# Patient Record
Sex: Male | Born: 1970 | Hispanic: No | Marital: Married | Smoking: Former smoker
Health system: Southern US, Community
[De-identification: ages and names within clinical notes are randomized; demographics above are authoritative.]

## PROBLEM LIST (undated history)

## (undated) DIAGNOSIS — K219 Gastro-esophageal reflux disease without esophagitis: Secondary | ICD-10-CM

## (undated) HISTORY — PX: CHOLECYSTECTOMY: SHX55

---
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
## 2011-08-01 ENCOUNTER — Other Ambulatory Visit: Payer: Self-pay | Admitting: Gastroenterology

## 2011-08-01 DIAGNOSIS — R7402 Elevation of levels of lactic acid dehydrogenase (LDH): Secondary | ICD-10-CM

## 2011-08-10 ENCOUNTER — Ambulatory Visit
Admission: RE | Admit: 2011-08-10 | Discharge: 2011-08-10 | Disposition: A | Discharge status: Home / Self Care | Payer: BC Managed Care – PPO | Source: Ambulatory Visit | Attending: Gastroenterology | Admitting: Gastroenterology

## 2011-08-10 DIAGNOSIS — R7402 Elevation of levels of lactic acid dehydrogenase (LDH): Secondary | ICD-10-CM

## 2016-11-08 ENCOUNTER — Encounter (HOSPITAL_BASED_OUTPATIENT_CLINIC_OR_DEPARTMENT_OTHER): Payer: Self-pay

## 2016-11-08 ENCOUNTER — Emergency Department (HOSPITAL_BASED_OUTPATIENT_CLINIC_OR_DEPARTMENT_OTHER)
Admission: EM | Admit: 2016-11-08 | Discharge: 2016-11-08 | Disposition: A | Discharge status: Home / Self Care | Payer: BLUE CROSS/BLUE SHIELD | Attending: Emergency Medicine | Admitting: Emergency Medicine

## 2016-11-08 ENCOUNTER — Emergency Department (HOSPITAL_BASED_OUTPATIENT_CLINIC_OR_DEPARTMENT_OTHER): Payer: BLUE CROSS/BLUE SHIELD

## 2016-11-08 DIAGNOSIS — R945 Abnormal results of liver function studies: Secondary | ICD-10-CM

## 2016-11-08 DIAGNOSIS — R7989 Other specified abnormal findings of blood chemistry: Secondary | ICD-10-CM | POA: Diagnosis not present

## 2016-11-08 DIAGNOSIS — Z87891 Personal history of nicotine dependence: Secondary | ICD-10-CM | POA: Diagnosis not present

## 2016-11-08 DIAGNOSIS — R1013 Epigastric pain: Secondary | ICD-10-CM | POA: Diagnosis present

## 2016-11-08 DIAGNOSIS — K839 Disease of biliary tract, unspecified: Secondary | ICD-10-CM | POA: Insufficient documentation

## 2016-11-08 DIAGNOSIS — K8050 Calculus of bile duct without cholangitis or cholecystitis without obstruction: Secondary | ICD-10-CM

## 2016-11-08 HISTORY — DX: Gastro-esophageal reflux disease without esophagitis: K21.9

## 2016-11-08 LAB — COMPREHENSIVE METABOLIC PANEL
ALT: 104 U/L — ABNORMAL HIGH (ref 17–63)
AST: 46 U/L — AB (ref 15–41)
Albumin: 4.5 g/dL (ref 3.5–5.0)
Alkaline Phosphatase: 162 U/L — ABNORMAL HIGH (ref 38–126)
Anion gap: 7 (ref 5–15)
BILIRUBIN TOTAL: 0.4 mg/dL (ref 0.3–1.2)
BUN: 13 mg/dL (ref 6–20)
CO2: 31 mmol/L (ref 22–32)
CREATININE: 0.9 mg/dL (ref 0.61–1.24)
Calcium: 9.5 mg/dL (ref 8.9–10.3)
Chloride: 99 mmol/L — ABNORMAL LOW (ref 101–111)
GFR calc Af Amer: >60 mL/min (ref >60)
Glucose, Bld: 136 mg/dL — ABNORMAL HIGH (ref 65–99)
Potassium: 3.5 mmol/L (ref 3.5–5.1)
Sodium: 137 mmol/L (ref 135–145)
TOTAL PROTEIN: 7.2 g/dL (ref 6.5–8.1)

## 2016-11-08 LAB — CBC WITH DIFFERENTIAL/PLATELET
Basophils Absolute: 0.1 10*3/uL (ref 0.0–0.1)
Basophils Relative: 1 %
Eosinophils Absolute: 0.6 10*3/uL (ref 0.0–0.7)
Eosinophils Relative: 9 %
HEMATOCRIT: 45.2 % (ref 39.0–52.0)
Hemoglobin: 15.5 g/dL (ref 13.0–17.0)
LYMPHS ABS: 2.3 10*3/uL (ref 0.7–4.0)
LYMPHS PCT: 38 %
MCH: 29.6 pg (ref 26.0–34.0)
MCHC: 34.3 g/dL (ref 30.0–36.0)
MCV: 86.4 fL (ref 78.0–100.0)
MONO ABS: 0.4 10*3/uL (ref 0.1–1.0)
MONOS PCT: 6 %
Neutro Abs: 2.8 10*3/uL (ref 1.7–7.7)
Neutrophils Relative %: 46 %
Platelets: 185 10*3/uL (ref 150–400)
RBC: 5.23 MIL/uL (ref 4.22–5.81)
RDW: 12.7 % (ref 11.5–15.5)
WBC: 6.1 10*3/uL (ref 4.0–10.5)

## 2016-11-08 LAB — LIPASE, BLOOD: LIPASE: 27 U/L (ref 11–51)

## 2016-11-08 MED ORDER — MORPHINE SULFATE (PF) 4 MG/ML IV SOLN
4.0000 mg | Freq: Once | INTRAVENOUS | Status: AC
  Administered 2016-11-08: 4 mg via INTRAVENOUS
  Filled 2016-11-08: qty 1

## 2016-11-08 MED ORDER — IOPAMIDOL (ISOVUE-300) INJECTION 61%
100.0000 mL | Freq: Once | INTRAVENOUS | Status: AC | PRN
  Administered 2016-11-08: 100 mL via INTRAVENOUS

## 2016-11-08 MED ORDER — ONDANSETRON HCL 4 MG/2ML IJ SOLN
4.0000 mg | Freq: Once | INTRAMUSCULAR | Status: AC
  Administered 2016-11-08: 4 mg via INTRAVENOUS
  Filled 2016-11-08: qty 2

## 2016-11-08 MED ORDER — HYDROCODONE-ACETAMINOPHEN 5-325 MG PO TABS: 1.0000 | ORAL_TABLET | Freq: Four times a day (QID) | ORAL | 0 refills | Status: DC | PRN

## 2016-11-08 MED ORDER — ONDANSETRON 4 MG PO TBDP
ORAL_TABLET | ORAL | Status: AC
  Administered 2016-11-08: 4 mg
  Filled 2016-11-08: qty 1

## 2016-11-08 MED ORDER — ONDANSETRON 4 MG PO TBDP: 4.0000 mg | ORAL_TABLET | Freq: Three times a day (TID) | ORAL | 0 refills | Status: DC | PRN

## 2016-11-08 MED ORDER — PANTOPRAZOLE SODIUM 40 MG PO TBEC
40.0000 mg | DELAYED_RELEASE_TABLET | Freq: Once | ORAL | Status: AC
  Administered 2016-11-08: 40 mg via ORAL
  Filled 2016-11-08: qty 1

## 2016-11-08 MED ORDER — PANTOPRAZOLE SODIUM 40 MG IV SOLR
40.0000 mg | Freq: Once | INTRAVENOUS | Status: AC
  Administered 2016-11-08: 40 mg via INTRAVENOUS
  Filled 2016-11-08: qty 40

## 2016-11-08 MED ORDER — GI COCKTAIL ~~LOC~~
30.0000 mL | Freq: Once | ORAL | Status: AC
  Administered 2016-11-08: 30 mL via ORAL
  Filled 2016-11-08: qty 30

## 2016-11-08 MED ORDER — ONDANSETRON 4 MG PO TBDP
ORAL_TABLET | ORAL | Status: AC
  Filled 2016-11-08: qty 1

## 2016-11-08 NOTE — ED Notes (Signed)
Pt rang out to state he needs to vomit

## 2016-11-08 NOTE — ED Triage Notes (Signed)
Pt c/o several episodes of epigastric burning and pressure for the last month.  He went to a clinic about 2 weeks ago and was given what looks like a GI cocktail prescription and told to take omeprazole daily.  He has not taken omeprazole, but has been using the GI cocktail and getting relief.  Tonight, he has had the pain since 2200 despite the medication.  He last ate at 1900, denies SOB, denies diaphoresis.  States the pain goes through to his middle back.

## 2016-11-08 NOTE — ED Provider Notes (Signed)
[facility]   CSN: [id number] Arrival date & time: 11/08/16  0245     History   Chief Complaint Chief Complaint  Patient presents with  . Epigastric Pain    HPI Kristin BruinsRoger Shellenbarger is a 46 y.o. male.  HPI  This is a 46 year old male who presents with upper abdominal pain. Patient reports that he has been seen twice in the last month for recurrent postprandial pain. He has been treated with a GI cocktail which "sometimes helps." He was also told to take omeprazole daily but has not been doing this. Tonight he has had worsening epigastric pain over the last several hours. No fevers. Reports nausea without vomiting. No diarrhea. Current pain is 9 out of 10. Denies chest pain or shortness of breath.  Past Medical History:  Diagnosis Date  . GERD (gastroesophageal reflux disease)     There are no active problems to display for this patient.   Past Surgical History:  Procedure Laterality Date  . APPENDECTOMY  1988       Home Medications    Prior to Admission medications   Medication Sig Start Date End Date Taking? Authorizing Provider  HYDROcodone-acetaminophen (NORCO/VICODIN) 5-325 MG tablet Take 1-2 tablets by mouth every 6 (six) hours as needed. 11/08/16   Horton, Mayer Maskerourtney F, MD  ondansetron (ZOFRAN ODT) 4 MG disintegrating tablet Take 1 tablet (4 mg total) by mouth every 8 (eight) hours as needed for nausea or vomiting. 11/08/16   Horton, Mayer Maskerourtney F, MD    Family History No family history on file.  Social History Social History  Substance Use Topics  . Smoking status: Former Games developermoker  . Smokeless tobacco: Never Used  . Alcohol use Yes     Comment: occasional     Allergies   Patient has no known allergies.   Review of Systems Review of Systems  Constitutional: Negative for fever.  Respiratory: Negative for shortness of breath.   Cardiovascular: Negative for chest pain.  Gastrointestinal: Positive for abdominal pain and nausea. Negative  for diarrhea and vomiting.  Genitourinary: Negative for dysuria.  All other systems reviewed and are negative.    Physical Exam Updated Vital Signs BP (!) 143/90   Pulse 60   Temp 98.1 F (36.7 C) (Oral)   Resp 18   Ht 5\' 7"  (1.702 m)   Wt 72.6 kg (160 lb)   SpO2 99%   BMI 25.06 kg/m   Physical Exam  Constitutional: He is oriented to person, place, and time. He appears well-developed and well-nourished.  HENT:  Head: Normocephalic and atraumatic.  Cardiovascular: Normal rate, regular rhythm and normal heart sounds.   No murmur heard. Pulmonary/Chest: Effort normal and breath sounds normal. No respiratory distress. He has no wheezes.  Abdominal: Soft. Bowel sounds are normal. There is tenderness. There is no rebound.  Epigastric tenderness palpation, negative Murphy sign  Musculoskeletal: He exhibits no edema.  Neurological: He is alert and oriented to person, place, and time.  Skin: Skin is warm and dry.  Psychiatric: He has a normal mood and affect.  Nursing note and vitals reviewed.    ED Treatments / Results  Labs (all labs ordered are listed, but only abnormal results are displayed) Labs Reviewed  COMPREHENSIVE METABOLIC PANEL - Abnormal; Notable for the following:       Result Value   Chloride 99 (*)    Glucose, Bld 136 (*)    AST 46 (*)    ALT 104 (*)  Alkaline Phosphatase 162 (*)    All other components within normal limits  CBC WITH DIFFERENTIAL/PLATELET  LIPASE, BLOOD    EKG  EKG Interpretation  Date/Time:  Wednesday November 08 2016 02:50:46 EDT Ventricular Rate:  57 PR Interval:    QRS Duration: 98 QT Interval:  426 QTC Calculation: 415 R Axis:   92 Text Interpretation:  Sinus rhythm Consider left atrial enlargement Borderline right axis deviation Probable left ventricular hypertrophy ST elev, probable normal early repol pattern No prior for comparison Confirmed by Ross Marcus ([id number]) on 11/08/2016 3:17:55 AM       Radiology Ct  Abdomen Pelvis W Contrast  Result Date: 11/08/2016 CLINICAL DATA:  Epigastric pain.  Nausea. EXAM: CT ABDOMEN AND PELVIS WITH CONTRAST TECHNIQUE: Multidetector CT imaging of the abdomen and pelvis was performed using the standard protocol following bolus administration of intravenous contrast. CONTRAST:  ISOVUE-300 IOPAMIDOL (ISOVUE-300) INJECTION 61% COMPARISON:  Ultrasound 07/31/2011 FINDINGS: Lower chest: Linear right lower lobe atelectasis. No consolidation or pleural fluid. Hepatobiliary: Diffusely decreased density consistent with steatosis. No focal hepatic lesion. Gallstones within the gallbladder which is physiologically distended. No pericholecystic inflammation. No biliary ductal dilatation. Pancreas: No ductal dilatation or inflammation. Spleen: Normal in size without focal abnormality. Adrenals/Urinary Tract: Adrenal glands are unremarkable. Kidneys are normal, without renal calculi, focal lesion, or hydronephrosis. Bladder is unremarkable. Stomach/Bowel: Stomach distended with ingested contrast. No gastric wall thickening. No small bowel obstruction, inflammation or wall thickening. Moderate stool in the cecum and ascending colon, small volume of stool in the descending and sigmoid colon. No colonic inflammation. Post appendectomy. Vascular/Lymphatic: No significant vascular findings are present. No enlarged abdominal or pelvic lymph nodes. Reproductive: Prostate is unremarkable. Other: No free air, free fluid, or intra-abdominal fluid collection. Tiny fat containing umbilical hernia. Musculoskeletal: There are no acute or suspicious osseous abnormalities. Bone island in the right proximal femur. IMPRESSION: 1. No acute abnormality or explanation for abdominal pain. 2. Cholelithiasis without CT findings of gallbladder inflammation. 3. Hepatic steatosis. Electronically Signed   By: Rubye Oaks M.D.   On: 11/08/2016 05:51    Procedures Procedures (including critical care  time)  Medications Ordered in ED Medications  ondansetron (ZOFRAN-ODT) 4 MG disintegrating tablet (not administered)  gi cocktail (Maalox,Lidocaine,Donnatal) (30 mLs Oral Given 11/08/16 0320)  pantoprazole (PROTONIX) EC tablet 40 mg (40 mg Oral Given 11/08/16 0319)  ondansetron (ZOFRAN-ODT) 4 MG disintegrating tablet (4 mg  Given 11/08/16 0325)  pantoprazole (PROTONIX) injection 40 mg (40 mg Intravenous Given 11/08/16 0342)  ondansetron (ZOFRAN) injection 4 mg (4 mg Intravenous Given 11/08/16 0357)  morphine 4 MG/ML injection 4 mg (4 mg Intravenous Given 11/08/16 0414)  iopamidol (ISOVUE-300) 61 % injection 100 mL (100 mLs Intravenous Contrast Given 11/08/16 0526)     Initial Impression / Assessment and Plan / ED Course  I have reviewed the triage vital signs and the nursing notes.  Pertinent labs & imaging results that were available during my care of the patient were reviewed by me and considered in my medical decision making (see chart for details).     Patient presents with refractory postprandial abdominal pain. He is nontoxic but uncomfortable appearing on exam. Mostly epigastric tenderness. Considerations include reflux, peptic ulcers, pancreatitis, gallbladder pathology. He denies daily alcohol use. Initially patient was given a GI cocktail and oral Zofran. However, he developed emesis that was nonbloody. IV was placed and patient was given pain and nausea medication. Initial workup notable for elevated LFTs with AST of 46, ALT 104,  alkaline phosphatase 162. No access to the formal ultrasound at this time. CT scan obtained and does show evidence of cholelithiasis. No evidence of gallbladder inflammation. On recheck, patient's pain is down to her before. He states he feels much better. He is able now to tolerate fluids. Suspect he may have it on a biliary colic. No indication right now for emergent admission and gallbladder removal; however, he does need to follow-up with general surgery and  needs follow-up within 1 week for repeat LFTs. I discussed this with the patient. He understands and agrees with plan. He was also given return precautions including worsening pain, fevers, intractable vomiting.  After history, exam, and medical workup I feel the patient has been appropriately medically screened and is safe for discharge home. Pertinent diagnoses were discussed with the patient. Patient was given return precautions.   Final Clinical Impressions(s) / ED Diagnoses   Final diagnoses:  Biliary colic  Elevated LFTs    New Prescriptions New Prescriptions   HYDROCODONE-ACETAMINOPHEN (NORCO/VICODIN) 5-325 MG TABLET    Take 1-2 tablets by mouth every 6 (six) hours as needed.   ONDANSETRON (ZOFRAN ODT) 4 MG DISINTEGRATING TABLET    Take 1 tablet (4 mg total) by mouth every 8 (eight) hours as needed for nausea or vomiting.     Shon Baton, MD 11/08/16 0630

## 2016-11-08 NOTE — ED Notes (Signed)
Pt drinking contrast, scan to be performed around 0530.

## 2016-11-08 NOTE — ED Notes (Signed)
Pt verbalizes understanding of d/c instructions and denies any further needs at this time. 

## 2016-11-08 NOTE — ED Notes (Signed)
Patient transported to CT 

## 2016-11-08 NOTE — ED Notes (Signed)
Pt returned from CT °

## 2016-11-08 NOTE — ED Notes (Signed)
Pt tolerating PO fluids

## 2016-11-08 NOTE — Discharge Instructions (Signed)
You were seen today for upper abdominal pain associated with eating. You have gallstones. You also have evidence of mild elevation of her liver enzymes. You need to have these rechecked by her primary physician in one week. Contact the general surgeon provided above to discuss gallbladder removal. If you develop worsening pain, fevers, recurrent vomiting, you should be reevaluated immediately.

## 2016-11-13 ENCOUNTER — Other Ambulatory Visit: Payer: Self-pay | Admitting: Surgery

## 2016-11-27 ENCOUNTER — Other Ambulatory Visit: Payer: Self-pay | Admitting: Surgery

## 2017-11-16 IMAGING — CT CT ABD-PELV W/ CM
2 of 5 series · 16 of 46 positions shown, 18 images · IV contrast (APPLIED)
Comparison: Ultrasound 07/31/2011

CLINICAL DATA: Epigastric pain.  Nausea.

EXAM:
CT ABDOMEN AND PELVIS WITH CONTRAST
TECHNIQUE: Multidetector CT imaging of the abdomen and pelvis was performed
using the standard protocol following bolus administration of
intravenous contrast.
CONTRAST:  100mL WC4OIC-188 IOPAMIDOL (WC4OIC-188) INJECTION 61%

[Series 3: axial st · axial · 0.77mm/px · z∈[-514,-28]mm · 13 of 111 slices shown, 15 images]
[im 7/111  soft-tissue]
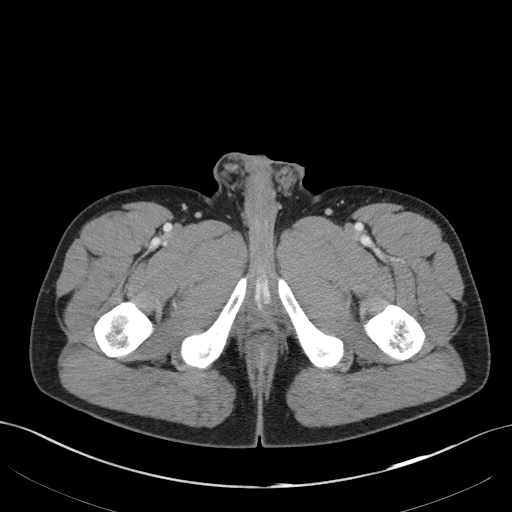
[im 7/111  bone]
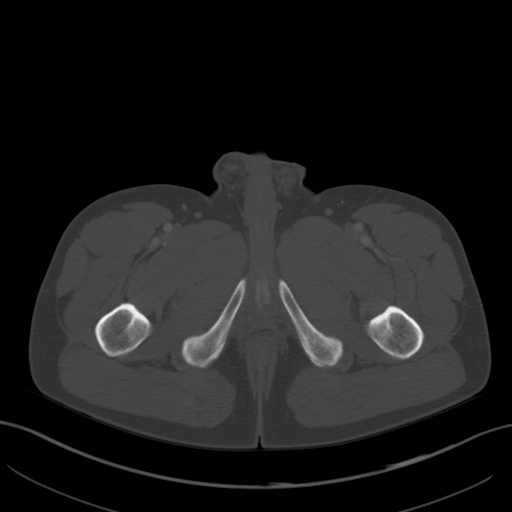
[im 13/111  soft-tissue]
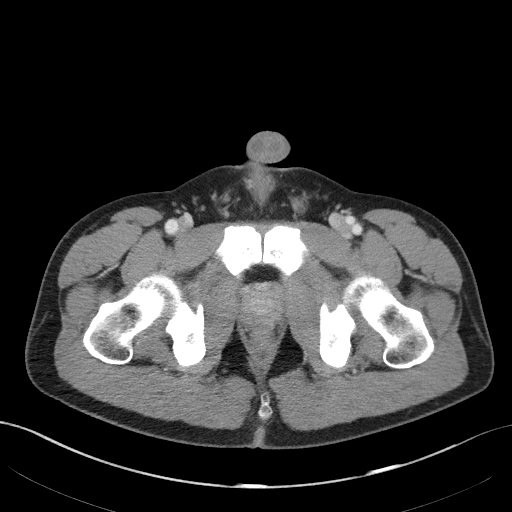
[im 25/111  soft-tissue]
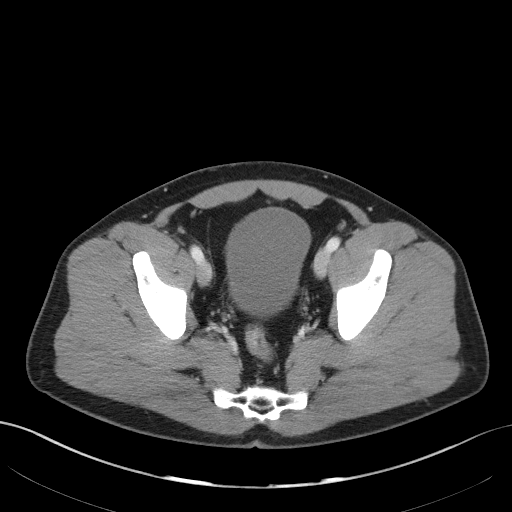
[im 31/111  soft-tissue]
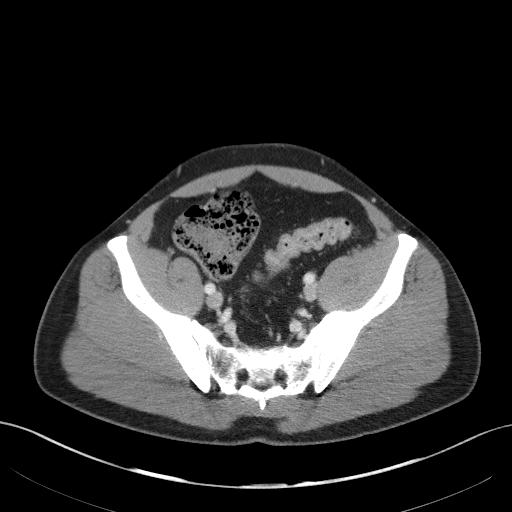
[im 37/111  soft-tissue]
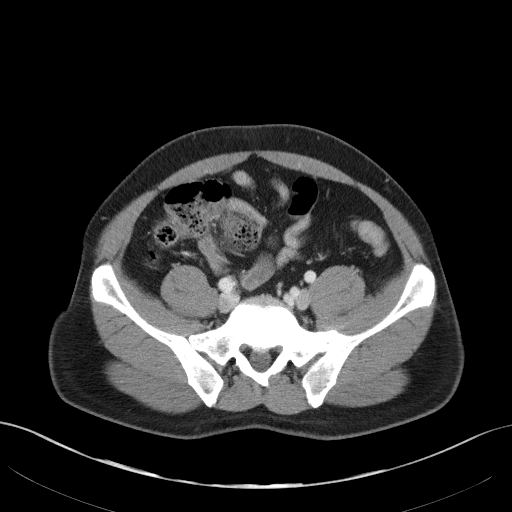
[im 49/111  soft-tissue]
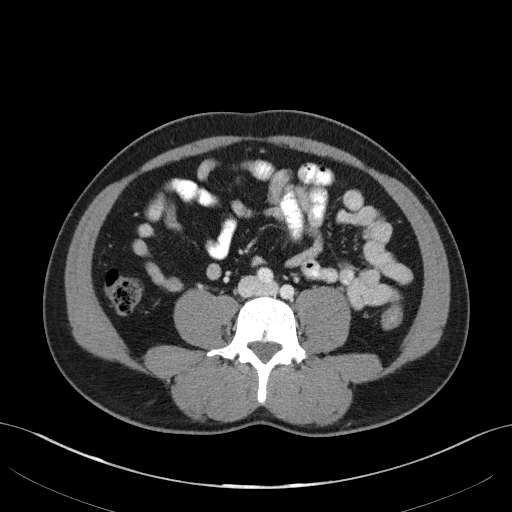
[im 56/111  soft-tissue]
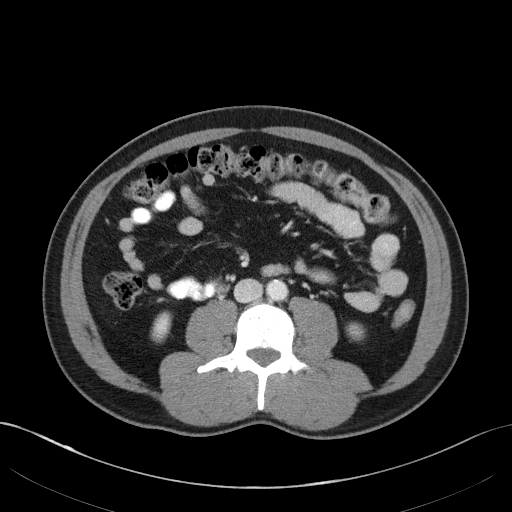
[im 62/111  soft-tissue]
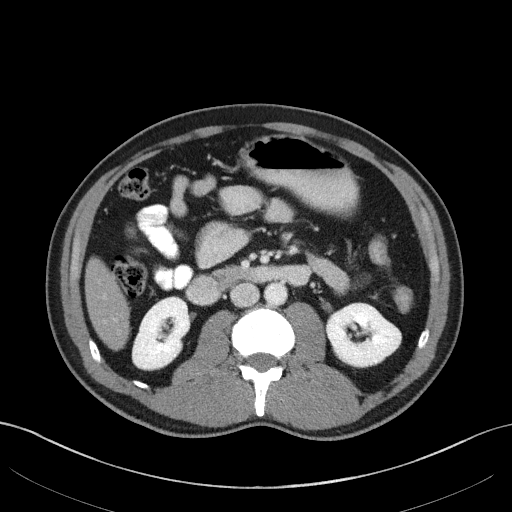
[im 74/111  soft-tissue]
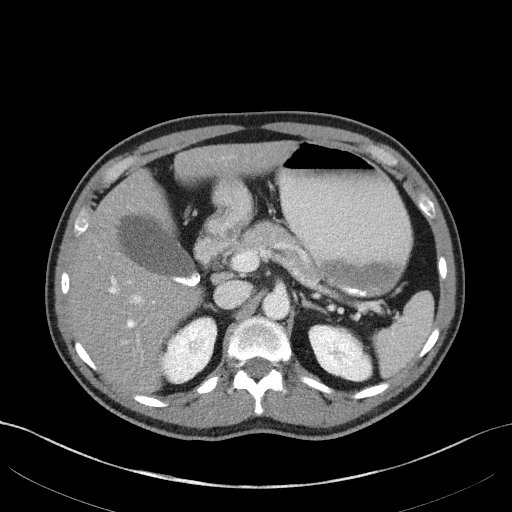
[im 74/111  bone]
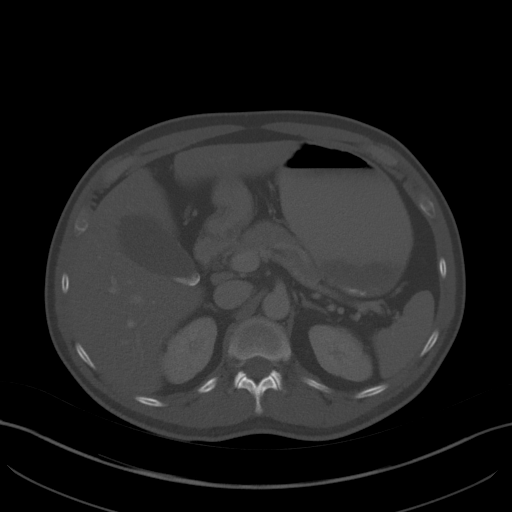
[im 80/111  soft-tissue]
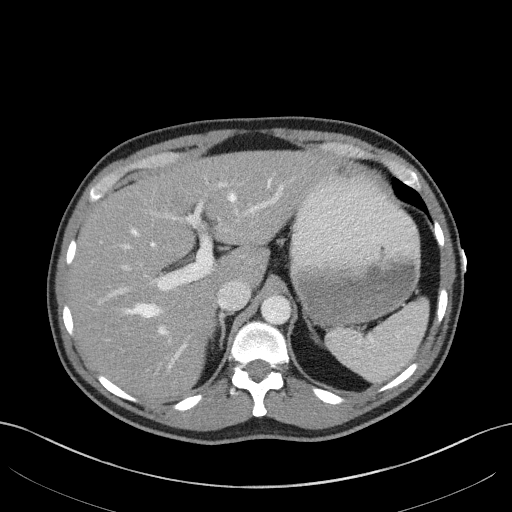
[im 86/111  soft-tissue]
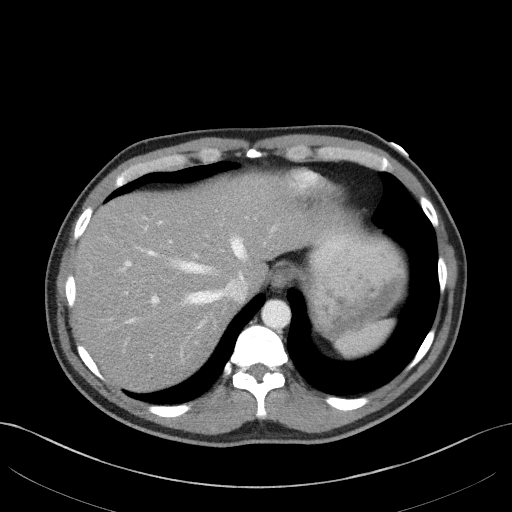
[im 98/111  soft-tissue]
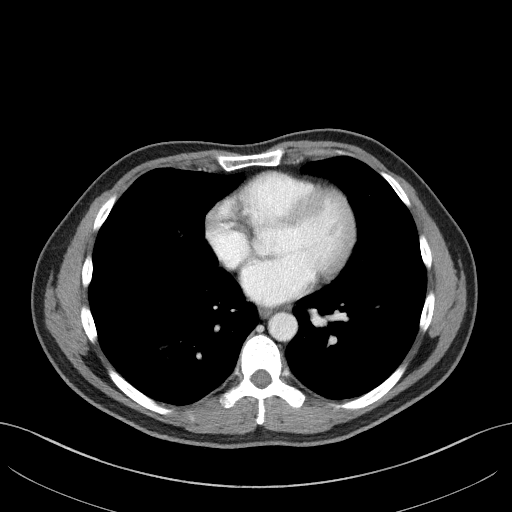
[im 104/111  soft-tissue]
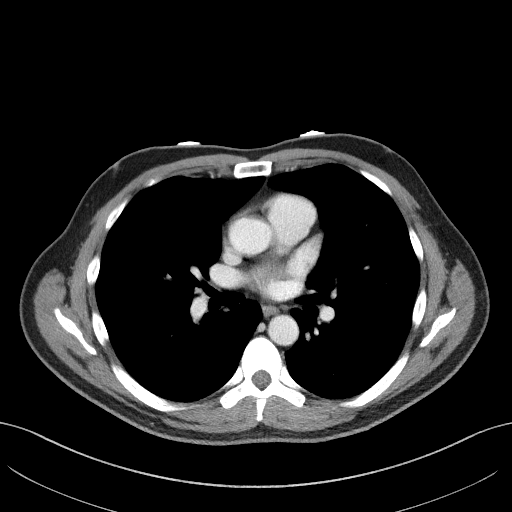

[Series 6: coronal st · coronal · 0.83mm/px · 3 of 87 slices shown]
[im 29/87  soft-tissue]
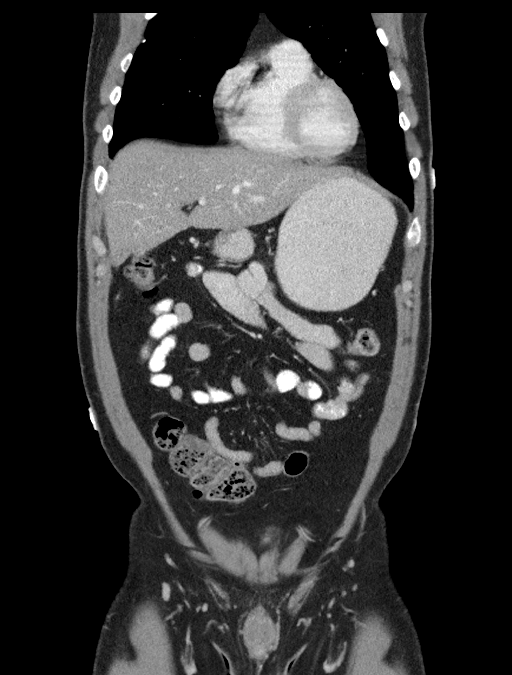
[im 39/87  soft-tissue]
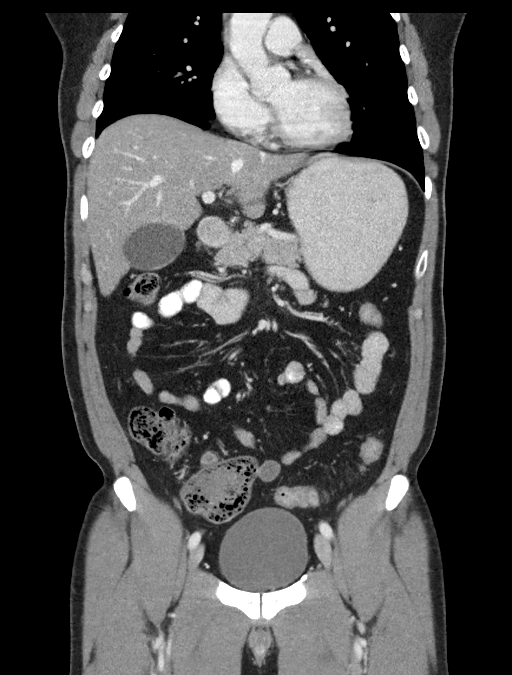
[im 48/87  soft-tissue]
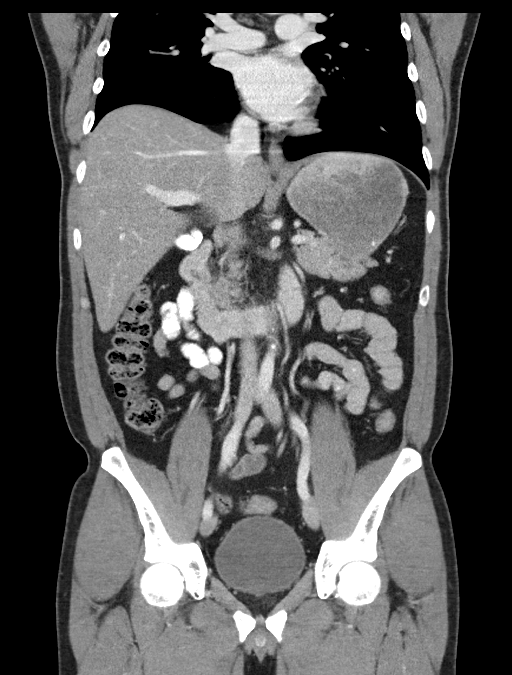

[16 of 46 positions shown; findings below may reference images not displayed]

FINDINGS: Lower chest: Linear right lower lobe atelectasis. No consolidation
or pleural fluid.

Hepatobiliary: Diffusely decreased density consistent with
steatosis. No focal hepatic lesion. Gallstones within the
gallbladder which is physiologically distended. No pericholecystic
inflammation. No biliary ductal dilatation.

Pancreas: No ductal dilatation or inflammation.

Spleen: Normal in size without focal abnormality.

Adrenals/Urinary Tract: Adrenal glands are unremarkable. Kidneys are
normal, without renal calculi, focal lesion, or hydronephrosis.
Bladder is unremarkable.

Stomach/Bowel: Stomach distended with ingested contrast. No gastric
wall thickening. No small bowel obstruction, inflammation or wall
thickening. Moderate stool in the cecum and ascending colon, small
volume of stool in the descending and sigmoid colon. No colonic
inflammation. Post appendectomy.

Vascular/Lymphatic: No significant vascular findings are present. No
enlarged abdominal or pelvic lymph nodes.

Reproductive: Prostate is unremarkable.

Other: No free air, free fluid, or intra-abdominal fluid collection.
Tiny fat containing umbilical hernia.

Musculoskeletal: There are no acute or suspicious osseous
abnormalities. Bone island in the right proximal femur.
IMPRESSION: 1. No acute abnormality or explanation for abdominal pain.
2. Cholelithiasis without CT findings of gallbladder inflammation.
3. Hepatic steatosis.

## 2017-12-24 DIAGNOSIS — Z0000 Encounter for general adult medical examination without abnormal findings: Secondary | ICD-10-CM | POA: Diagnosis not present

## 2017-12-24 DIAGNOSIS — E785 Hyperlipidemia, unspecified: Secondary | ICD-10-CM | POA: Diagnosis not present

## 2017-12-24 DIAGNOSIS — Z23 Encounter for immunization: Secondary | ICD-10-CM | POA: Diagnosis not present

## 2017-12-24 DIAGNOSIS — Z13220 Encounter for screening for lipoid disorders: Secondary | ICD-10-CM | POA: Diagnosis not present

## 2017-12-24 DIAGNOSIS — Z131 Encounter for screening for diabetes mellitus: Secondary | ICD-10-CM | POA: Diagnosis not present

## 2018-04-03 DIAGNOSIS — R0683 Snoring: Secondary | ICD-10-CM | POA: Diagnosis not present

## 2018-04-03 DIAGNOSIS — G4719 Other hypersomnia: Secondary | ICD-10-CM | POA: Diagnosis not present

## 2018-04-03 DIAGNOSIS — G478 Other sleep disorders: Secondary | ICD-10-CM | POA: Diagnosis not present

## 2018-04-19 DIAGNOSIS — J3489 Other specified disorders of nose and nasal sinuses: Secondary | ICD-10-CM | POA: Diagnosis not present

## 2018-04-19 DIAGNOSIS — G4733 Obstructive sleep apnea (adult) (pediatric): Secondary | ICD-10-CM | POA: Diagnosis not present

## 2018-04-22 DIAGNOSIS — G4733 Obstructive sleep apnea (adult) (pediatric): Secondary | ICD-10-CM | POA: Diagnosis not present

## 2018-05-15 DIAGNOSIS — J310 Chronic rhinitis: Secondary | ICD-10-CM | POA: Diagnosis not present

## 2018-05-15 DIAGNOSIS — J342 Deviated nasal septum: Secondary | ICD-10-CM | POA: Diagnosis not present

## 2018-05-15 DIAGNOSIS — J343 Hypertrophy of nasal turbinates: Secondary | ICD-10-CM | POA: Diagnosis not present

## 2018-05-17 DIAGNOSIS — G4733 Obstructive sleep apnea (adult) (pediatric): Secondary | ICD-10-CM | POA: Diagnosis not present

## 2018-08-02 ENCOUNTER — Other Ambulatory Visit: Payer: Self-pay | Admitting: Otolaryngology

## 2018-08-08 ENCOUNTER — Encounter (HOSPITAL_BASED_OUTPATIENT_CLINIC_OR_DEPARTMENT_OTHER): Payer: Self-pay | Admitting: *Deleted

## 2018-08-08 ENCOUNTER — Other Ambulatory Visit: Payer: Self-pay

## 2018-08-13 ENCOUNTER — Other Ambulatory Visit (HOSPITAL_COMMUNITY)
Admission: RE | Admit: 2018-08-13 | Discharge: 2018-08-13 | Disposition: A | Discharge status: Home / Self Care | Payer: BC Managed Care – PPO | Source: Ambulatory Visit | Attending: Otolaryngology | Admitting: Otolaryngology

## 2018-08-13 DIAGNOSIS — Z1159 Encounter for screening for other viral diseases: Secondary | ICD-10-CM | POA: Insufficient documentation

## 2018-08-14 LAB — NOVEL CORONAVIRUS, NAA (HOSP ORDER, SEND-OUT TO REF LAB; TAT 18-24 HRS): SARS-CoV-2, NAA: NOT DETECTED

## 2018-08-16 ENCOUNTER — Other Ambulatory Visit: Payer: Self-pay

## 2018-08-16 ENCOUNTER — Ambulatory Visit (HOSPITAL_BASED_OUTPATIENT_CLINIC_OR_DEPARTMENT_OTHER)
Admission: RE | Admit: 2018-08-16 | Discharge: 2018-08-16 | Disposition: A | Discharge status: Home / Self Care | Payer: BC Managed Care – PPO | Attending: Otolaryngology | Admitting: Otolaryngology

## 2018-08-16 ENCOUNTER — Encounter (HOSPITAL_BASED_OUTPATIENT_CLINIC_OR_DEPARTMENT_OTHER): Payer: Self-pay

## 2018-08-16 ENCOUNTER — Ambulatory Visit (HOSPITAL_BASED_OUTPATIENT_CLINIC_OR_DEPARTMENT_OTHER): Payer: BC Managed Care – PPO | Admitting: Anesthesiology

## 2018-08-16 ENCOUNTER — Encounter (HOSPITAL_BASED_OUTPATIENT_CLINIC_OR_DEPARTMENT_OTHER)
Admission: RE | Disposition: A | Discharge status: Home / Self Care | Payer: Self-pay | Source: Home / Self Care | Attending: Otolaryngology

## 2018-08-16 DIAGNOSIS — Z87891 Personal history of nicotine dependence: Secondary | ICD-10-CM | POA: Insufficient documentation

## 2018-08-16 DIAGNOSIS — J342 Deviated nasal septum: Secondary | ICD-10-CM | POA: Insufficient documentation

## 2018-08-16 DIAGNOSIS — G4730 Sleep apnea, unspecified: Secondary | ICD-10-CM | POA: Insufficient documentation

## 2018-08-16 DIAGNOSIS — J310 Chronic rhinitis: Secondary | ICD-10-CM | POA: Diagnosis not present

## 2018-08-16 DIAGNOSIS — J3489 Other specified disorders of nose and nasal sinuses: Secondary | ICD-10-CM | POA: Diagnosis not present

## 2018-08-16 DIAGNOSIS — J343 Hypertrophy of nasal turbinates: Secondary | ICD-10-CM | POA: Insufficient documentation

## 2018-08-16 DIAGNOSIS — J302 Other seasonal allergic rhinitis: Secondary | ICD-10-CM | POA: Insufficient documentation

## 2018-08-16 HISTORY — PX: NASAL SEPTOPLASTY W/ TURBINOPLASTY: SHX2070

## 2018-08-16 SURGERY — SEPTOPLASTY, NOSE, WITH NASAL TURBINATE REDUCTION
Anesthesia: General | Site: Nose | Laterality: Bilateral

## 2018-08-16 MED ORDER — LACTATED RINGERS IV SOLN
INTRAVENOUS | Status: DC
  Administered 2018-08-16 (×2): via INTRAVENOUS

## 2018-08-16 MED ORDER — ACETAMINOPHEN 160 MG/5ML PO SOLN: 325.0000 mg | ORAL | Status: DC | PRN

## 2018-08-16 MED ORDER — HYDRALAZINE HCL 20 MG/ML IJ SOLN
10.0000 mg | Freq: Once | INTRAMUSCULAR | Status: AC
  Administered 2018-08-16: 11:00:00 via INTRAVENOUS

## 2018-08-16 MED ORDER — OXYCODONE HCL 5 MG/5ML PO SOLN: 5.0000 mg | Freq: Once | ORAL | Status: DC | PRN

## 2018-08-16 MED ORDER — SUGAMMADEX SODIUM 200 MG/2ML IV SOLN
INTRAVENOUS | Status: DC | PRN
  Administered 2018-08-16: 200 mg via INTRAVENOUS

## 2018-08-16 MED ORDER — AMOXICILLIN 875 MG PO TABS: 875.0000 mg | ORAL_TABLET | Freq: Two times a day (BID) | ORAL | 0 refills | Status: AC

## 2018-08-16 MED ORDER — SCOPOLAMINE 1 MG/3DAYS TD PT72: 1.0000 | MEDICATED_PATCH | Freq: Once | TRANSDERMAL | Status: DC

## 2018-08-16 MED ORDER — PROPOFOL 10 MG/ML IV BOLUS
INTRAVENOUS | Status: AC
  Filled 2018-08-16: qty 20

## 2018-08-16 MED ORDER — HYDRALAZINE HCL 20 MG/ML IJ SOLN
INTRAMUSCULAR | Status: AC
  Filled 2018-08-16: qty 1

## 2018-08-16 MED ORDER — FENTANYL CITRATE (PF) 100 MCG/2ML IJ SOLN: 50.0000 ug | INTRAMUSCULAR | Status: DC | PRN

## 2018-08-16 MED ORDER — FENTANYL CITRATE (PF) 100 MCG/2ML IJ SOLN
INTRAMUSCULAR | Status: AC
  Filled 2018-08-16: qty 2

## 2018-08-16 MED ORDER — MUPIROCIN 2 % EX OINT
TOPICAL_OINTMENT | CUTANEOUS | Status: DC | PRN
  Administered 2018-08-16: 1 via NASAL

## 2018-08-16 MED ORDER — OXYCODONE HCL 5 MG PO TABS: 5.0000 mg | ORAL_TABLET | Freq: Once | ORAL | Status: DC | PRN

## 2018-08-16 MED ORDER — MUPIROCIN 2 % EX OINT
TOPICAL_OINTMENT | CUTANEOUS | Status: AC
  Filled 2018-08-16: qty 22

## 2018-08-16 MED ORDER — FENTANYL CITRATE (PF) 100 MCG/2ML IJ SOLN
25.0000 ug | INTRAMUSCULAR | Status: DC | PRN
  Administered 2018-08-16: 11:00:00 50 ug via INTRAVENOUS

## 2018-08-16 MED ORDER — ONDANSETRON HCL 4 MG/2ML IJ SOLN
INTRAMUSCULAR | Status: DC | PRN
  Administered 2018-08-16: 4 mg via INTRAVENOUS

## 2018-08-16 MED ORDER — PROPOFOL 10 MG/ML IV BOLUS
INTRAVENOUS | Status: DC | PRN
  Administered 2018-08-16: 160 mg via INTRAVENOUS

## 2018-08-16 MED ORDER — ROCURONIUM BROMIDE 100 MG/10ML IV SOLN
INTRAVENOUS | Status: DC | PRN
  Administered 2018-08-16: 60 mg via INTRAVENOUS

## 2018-08-16 MED ORDER — ONDANSETRON HCL 4 MG/2ML IJ SOLN
INTRAMUSCULAR | Status: AC
  Filled 2018-08-16: qty 2

## 2018-08-16 MED ORDER — LIDOCAINE-EPINEPHRINE 1 %-1:100000 IJ SOLN
INTRAMUSCULAR | Status: DC | PRN
  Administered 2018-08-16: 3 mL

## 2018-08-16 MED ORDER — MIDAZOLAM HCL 2 MG/2ML IJ SOLN
INTRAMUSCULAR | Status: AC
  Filled 2018-08-16: qty 2

## 2018-08-16 MED ORDER — CEFAZOLIN SODIUM 1 G IJ SOLR
INTRAMUSCULAR | Status: AC
  Filled 2018-08-16: qty 20

## 2018-08-16 MED ORDER — MEPERIDINE HCL 25 MG/ML IJ SOLN: 6.2500 mg | INTRAMUSCULAR | Status: DC | PRN

## 2018-08-16 MED ORDER — CEFAZOLIN SODIUM-DEXTROSE 2-3 GM-%(50ML) IV SOLR
INTRAVENOUS | Status: DC | PRN
  Administered 2018-08-16: 2 g via INTRAVENOUS

## 2018-08-16 MED ORDER — FENTANYL CITRATE (PF) 100 MCG/2ML IJ SOLN
INTRAMUSCULAR | Status: DC | PRN
  Administered 2018-08-16: 50 ug via INTRAVENOUS
  Administered 2018-08-16: 100 ug via INTRAVENOUS

## 2018-08-16 MED ORDER — LIDOCAINE HCL (CARDIAC) PF 100 MG/5ML IV SOSY
PREFILLED_SYRINGE | INTRAVENOUS | Status: DC | PRN
  Administered 2018-08-16: 75 mg via INTRAVENOUS

## 2018-08-16 MED ORDER — OXYMETAZOLINE HCL 0.05 % NA SOLN
NASAL | Status: DC | PRN
  Administered 2018-08-16: 1

## 2018-08-16 MED ORDER — DEXAMETHASONE SODIUM PHOSPHATE 10 MG/ML IJ SOLN
INTRAMUSCULAR | Status: AC
  Filled 2018-08-16: qty 1

## 2018-08-16 MED ORDER — MIDAZOLAM HCL 2 MG/2ML IJ SOLN: 1.0000 mg | INTRAMUSCULAR | Status: DC | PRN

## 2018-08-16 MED ORDER — ACETAMINOPHEN 325 MG PO TABS: 325.0000 mg | ORAL_TABLET | ORAL | Status: DC | PRN

## 2018-08-16 MED ORDER — ROCURONIUM BROMIDE 10 MG/ML (PF) SYRINGE
PREFILLED_SYRINGE | INTRAVENOUS | Status: AC
  Filled 2018-08-16: qty 10

## 2018-08-16 MED ORDER — ESMOLOL HCL 100 MG/10ML IV SOLN
INTRAVENOUS | Status: DC | PRN
  Administered 2018-08-16: 10 mg via INTRAVENOUS

## 2018-08-16 MED ORDER — ONDANSETRON HCL 4 MG/2ML IJ SOLN: 4.0000 mg | Freq: Once | INTRAMUSCULAR | Status: DC | PRN

## 2018-08-16 MED ORDER — OXYCODONE-ACETAMINOPHEN 5-325 MG PO TABS: 1.0000 | ORAL_TABLET | ORAL | 0 refills | Status: AC | PRN

## 2018-08-16 MED ORDER — LIDOCAINE 2% (20 MG/ML) 5 ML SYRINGE
INTRAMUSCULAR | Status: AC
  Filled 2018-08-16: qty 5

## 2018-08-16 MED ORDER — DEXAMETHASONE SODIUM PHOSPHATE 10 MG/ML IJ SOLN
INTRAMUSCULAR | Status: DC | PRN
  Administered 2018-08-16: 8 mg via INTRAVENOUS

## 2018-08-16 MED ORDER — MIDAZOLAM HCL 2 MG/2ML IJ SOLN
INTRAMUSCULAR | Status: DC | PRN
  Administered 2018-08-16: 2 mg via INTRAVENOUS

## 2018-08-16 SURGICAL SUPPLY — 34 items
ATTRACTOMAT 16X20 MAGNETIC DRP (DRAPES) IMPLANT
CANISTER SUCT 1200ML W/VALVE (MISCELLANEOUS) ×3 IMPLANT
COAGULATOR SUCT 8FR VV (MISCELLANEOUS) ×3 IMPLANT
COVER WAND RF STERILE (DRAPES) IMPLANT
DECANTER SPIKE VIAL GLASS SM (MISCELLANEOUS) IMPLANT
DRSG NASOPORE 8CM (GAUZE/BANDAGES/DRESSINGS) IMPLANT
DRSG TELFA 3X8 NADH (GAUZE/BANDAGES/DRESSINGS) IMPLANT
ELECT REM PT RETURN 9FT ADLT (ELECTROSURGICAL) ×3
ELECTRODE REM PT RTRN 9FT ADLT (ELECTROSURGICAL) ×1 IMPLANT
GLOVE BIO SURGEON STRL SZ7.5 (GLOVE) ×3 IMPLANT
GLOVE BIOGEL PI IND STRL 7.0 (GLOVE) ×1 IMPLANT
GLOVE BIOGEL PI INDICATOR 7.0 (GLOVE) ×2
GLOVE ECLIPSE 6.5 STRL STRAW (GLOVE) ×3 IMPLANT
GOWN STRL REUS W/ TWL LRG LVL3 (GOWN DISPOSABLE) ×2 IMPLANT
GOWN STRL REUS W/TWL LRG LVL3 (GOWN DISPOSABLE) ×4
NEEDLE HYPO 25X1 1.5 SAFETY (NEEDLE) ×3 IMPLANT
NS IRRIG 1000ML POUR BTL (IV SOLUTION) ×3 IMPLANT
PACK BASIN DAY SURGERY FS (CUSTOM PROCEDURE TRAY) ×3 IMPLANT
PACK ENT DAY SURGERY (CUSTOM PROCEDURE TRAY) ×3 IMPLANT
SLEEVE SCD COMPRESS KNEE MED (MISCELLANEOUS) ×3 IMPLANT
SOLUTION BUTLER CLEAR DIP (MISCELLANEOUS) ×3 IMPLANT
SPLINT NASAL AIRWAY SILICONE (MISCELLANEOUS) ×3 IMPLANT
SPONGE GAUZE 2X2 8PLY STER LF (GAUZE/BANDAGES/DRESSINGS) ×1
SPONGE GAUZE 2X2 8PLY STRL LF (GAUZE/BANDAGES/DRESSINGS) ×2 IMPLANT
SPONGE NEURO XRAY DETECT 1X3 (DISPOSABLE) ×3 IMPLANT
SUT CHROMIC 4 0 P 3 18 (SUTURE) ×3 IMPLANT
SUT PLAIN 4 0 ~~LOC~~ 1 (SUTURE) ×3 IMPLANT
SUT PROLENE 3 0 PS 2 (SUTURE) ×3 IMPLANT
SUT VIC AB 4-0 P-3 18XBRD (SUTURE) IMPLANT
SUT VIC AB 4-0 P3 18 (SUTURE)
TOWEL GREEN STERILE FF (TOWEL DISPOSABLE) ×3 IMPLANT
TUBE SALEM SUMP 12R W/ARV (TUBING) IMPLANT
TUBE SALEM SUMP 16 FR W/ARV (TUBING) ×3 IMPLANT
YANKAUER SUCT BULB TIP NO VENT (SUCTIONS) ×3 IMPLANT

## 2018-08-16 NOTE — Anesthesia Preprocedure Evaluation (Addendum)
Anesthesia Evaluation  Patient identified by MRN, date of birth, ID band Patient awake    Reviewed: Allergy & Precautions, H&P , NPO status , Patient's Chart, lab work & pertinent test results, reviewed documented beta blocker date and time   Airway Mallampati: I  TM Distance: >3 FB Neck ROM: full    Dental no notable dental hx. (+) Teeth Intact   Pulmonary neg pulmonary ROS, former smoker,    Pulmonary exam normal breath sounds clear to auscultation       Cardiovascular Exercise Tolerance: Good negative cardio ROS   Rhythm:regular Rate:Normal     Neuro/Psych negative neurological ROS  negative psych ROS   GI/Hepatic Neg liver ROS, GERD  ,  Endo/Other  negative endocrine ROS  Renal/GU negative Renal ROS  negative genitourinary   Musculoskeletal   Abdominal   Peds  Hematology negative hematology ROS (+)   Anesthesia Other Findings   Reproductive/Obstetrics negative OB ROS                            Anesthesia Physical Anesthesia Plan  ASA: II  Anesthesia Plan: General   Post-op Pain Management:    Induction: Intravenous  PONV Risk Score and Plan: 2 and Ondansetron and Dexamethasone  Airway Management Planned: Oral ETT  Additional Equipment:   Intra-op Plan:   Post-operative Plan: Extubation in OR  Informed Consent: I have reviewed the patients History and Physical, chart, labs and discussed the procedure including the risks, benefits and alternatives for the proposed anesthesia with the patient or authorized representative who has indicated his/her understanding and acceptance.     Dental Advisory Given  Plan Discussed with: CRNA, Anesthesiologist and Surgeon  Anesthesia Plan Comments: (  )        Anesthesia Quick Evaluation

## 2018-08-16 NOTE — Transfer of Care (Signed)
Immediate Anesthesia Transfer of Care Note  Patient: Nathaniel Padilla  Procedure(s) Performed: NASAL SEPTOPLASTY WITH  BILATERAL TURBINATE REDUCTION (Bilateral Nose)  Patient Location: PACU  Anesthesia Type:General  Level of Consciousness: awake and drowsy  Airway & Oxygen Therapy: Patient Spontanous Breathing and Patient connected to face mask oxygen  Post-op Assessment: Report given to RN and Post -op Vital signs reviewed and stable  Post vital signs: Reviewed and stable  Last Vitals:  Vitals Value Taken Time  BP 172/117 08/16/18 1001  Temp    Pulse 76 08/16/18 1002  Resp 12 08/16/18 1002  SpO2 99 % 08/16/18 1002  Vitals shown include unvalidated device data.  Last Pain:  Vitals:   08/16/18 0748  TempSrc: Oral  PainSc: 0-No pain         Complications: No apparent anesthesia complications

## 2018-08-16 NOTE — Anesthesia Postprocedure Evaluation (Signed)
Anesthesia Post Note  Patient: Nathaniel Padilla  Procedure(s) Performed: NASAL SEPTOPLASTY WITH  BILATERAL TURBINATE REDUCTION (Bilateral Nose)     Patient location during evaluation: PACU Anesthesia Type: General Level of consciousness: awake and alert Pain management: pain level controlled Vital Signs Assessment: post-procedure vital signs reviewed and stable Respiratory status: spontaneous breathing, nonlabored ventilation, respiratory function stable and patient connected to nasal cannula oxygen Cardiovascular status: blood pressure returned to baseline and stable Postop Assessment: no apparent nausea or vomiting Anesthetic complications: no    Last Vitals:  Vitals:   08/16/18 1045 08/16/18 1111  BP: (!) 158/91 (!) 151/88  Pulse: 94 91  Resp: 15 18  Temp:  37 C  SpO2: 94% 94%    Last Pain:  Vitals:   08/16/18 1111  TempSrc:   PainSc: 4                  Asad Keeven

## 2018-08-16 NOTE — Op Note (Signed)
DATE OF PROCEDURE: 08/16/2018  OPERATIVE REPORT   SURGEON: Leta Baptist, MD   PREOPERATIVE DIAGNOSES:  1. Severe nasal septal deviation.  2. Bilateral inferior turbinate hypertrophy.  3. Chronic nasal obstruction.  POSTOPERATIVE DIAGNOSES:  1. Severe nasal septal deviation.  2. Bilateral inferior turbinate hypertrophy.  3. Chronic nasal obstruction.  PROCEDURE PERFORMED:  1. Septoplasty.  2. Bilateral partial inferior turbinate resection.   ANESTHESIA: General endotracheal tube anesthesia.   COMPLICATIONS: None.   ESTIMATED BLOOD LOSS: 150 mL.   INDICATION FOR PROCEDURE: Nathaniel Padilla is a 48 y.o. male with a history of chronic nasal obstruction. The patient was  treated with antihistamine, decongestant, steroid nasal spray, and systemic steroids. However, the patient continued to be symptomatic. On examination, the patient was noted to have bilateral severe inferior turbinate hypertrophy and significant nasal septal deviation, causing significant nasal obstruction. Based on the above findings, the decision was made for the patient to undergo the above-stated procedures. The risks, benefits, alternatives, and details of the procedures were discussed with the patient. Questions were invited and answered. Informed consent was obtained.   DESCRIPTION OF PROCEDURE: The patient was taken to the operating room and placed supine on the operating table. General endotracheal tube anesthesia was administered by the anesthesiologist. The patient was positioned, and prepped and draped in the standard fashion for nasal surgery. Pledgets soaked with Afrin were placed in both nasal cavities for decongestion. The pledgets were subsequently removed.   Examination of the nasal cavity revealed a severe nasal septal deviationd. 1% lidocaine with 1:100,000 epinephrine was injected onto the nasal septum bilaterally. A hemitransfixion incision was made on the left side. The mucosal flap was carefully elevated on  the left side. A cartilaginous incision was made 1 cm superior to the caudal margin of the nasal septum. Mucosal flap was also elevated on the right side in the similar fashion. It should be noted that due to the severe septal deviation, the deviated portion of the cartilaginous and bony septum had to be removed in piecemeal fashion. Once the deviated portions were removed, a straight midline septum was achieved. The septum was then quilted with 4-0 plain gut sutures. The hemitransfixion incision was closed with interrupted 4-0 chromic sutures.   The inferior one half of both hypertrophied inferior turbinate was crossclamped with a Kelly clamp. The inferior one half of each inferior turbinate was then resected with a pair of cross cutting scissors. Hemostasis was achieved with a suction cautery device. Doyle splints were applied to the nasal septum.  The care of the patient was turned over to the anesthesiologist. The patient was awakened from anesthesia without difficulty. The patient was extubated and transferred to the recovery room in good condition.   OPERATIVE FINDINGS: Severe nasal septal deviation and bilateral inferior turbinate hypertrophy.   SPECIMEN: None.   FOLLOWUP CARE: The patient be discharged home once he is awake and alert. The patient will be placed on Percocet 1 tablets p.o. q.4 hours p.r.n. pain, and amoxicillin 875 mg p.o. b.i.d. for 5 days. The patient will follow up in my office in approximately 1 week for splint removal.   Keshawn Sundberg Raynelle Bring, MD

## 2018-08-16 NOTE — Discharge Instructions (Addendum)

## 2018-08-16 NOTE — Anesthesia Procedure Notes (Signed)
Procedure Name: Intubation Date/Time: 08/16/2018 9:00 AM Performed by: Raenette Rover, CRNA Pre-anesthesia Checklist: Patient identified, Emergency Drugs available, Patient being monitored and Suction available Patient Re-evaluated:Patient Re-evaluated prior to induction Oxygen Delivery Method: Circle system utilized Preoxygenation: Pre-oxygenation with 100% oxygen Induction Type: IV induction Ventilation: Mask ventilation without difficulty Laryngoscope Size: Miller and 3 Grade View: Grade I Tube type: Oral Tube size: 8.0 mm Number of attempts: 1 Airway Equipment and Method: Stylet Placement Confirmation: ETT inserted through vocal cords under direct vision,  positive ETCO2,  breath sounds checked- equal and bilateral and CO2 detector Secured at: 23 cm Tube secured with: Tape Dental Injury: Teeth and Oropharynx as per pre-operative assessment

## 2018-08-16 NOTE — H&P (Signed)
Cc: Chronic nasal obstruction  HPI: The patient is a 48 y/o male who presents today for evaluation of chronic nasal congestion. The patient is seen in consultation requested by [facility]. The patient has a history of loud snoring. He recently underwent a sleep study which showed mild sleep apnea. The patient was encouraged to maximize his nasal breathing and obtain an oral appliance. The patient has noted difficulty breathing through his nose for several years. He uses breathe right strips which help significantly. The patient has a history of seasonal allergies and uses Claritin. He has noted minimal relief with steroid nasal sprays in the past. The patient had an oral appliance made but has not yet started using it. He also was given a plastic device to open up his nose. He states it does help but is very uncomfortable. The patient denies facial pain or pressure. He has no history of recurrent sinus infections. Previous ENT surgery is denied.   The patient's review of systems (constitutional, eyes, ENT, cardiovascular, respiratory, GI, musculoskeletal, skin, neurologic, psychiatric, endocrine, hematologic, allergic) is noted in the ROS questionnaire.  It is reviewed with the patient.   Family health history: No HTN, DM, CAD, hearing loss or bleeding disorder.  Major events: Appendectomy, gallbladder removed.  Ongoing medical problems: None.  Social history: The patient is married. He denies the use of tobacco or illegal drugs. He drinks alcohol.  Exam: General: Communicates without difficulty, well nourished, no acute distress. Head: Normocephalic, no evidence injury, no tenderness, facial buttresses intact without stepoff. Eyes: PERRL, EOMI. No scleral icterus, conjunctivae clear. Neuro: CN II exam reveals vision grossly intact.  No nystagmus at any point of gaze. Ears: Auricles well formed without lesions.  Ear canals are intact without mass or lesion.  No erythema or edema is appreciated.   The TMs are intact without fluid. Nose: External evaluation reveals normal support and skin without lesions.  Dorsum is intact.  Anterior rhinoscopy reveals congested and edematous mucosa over anterior aspect of the inferior turbinates and nasal septum.  No purulence is noted. Middle meatus is not well visualized. Oral:  Oral cavity and oropharynx are intact, symmetric, without erythema or edema.  Mucosa is moist without lesions. Neck: Full range of motion without pain.  There is no significant lymphadenopathy.  No masses palpable.  Thyroid bed within normal limits to palpation.  Parotid glands and submandibular glands equal bilaterally without mass.  Trachea is midline. Neuro:  CN 2-12 grossly intact. Gait normal. Vestibular: No nystagmus at any point of gaze.   Procedure:  Flexible Nasal Endoscopy: Risks, benefits, and alternatives of flexible endoscopy were explained to the patient.  Specific mention was made of the risk of throat numbness with difficulty swallowing, possible bleeding from the nose and mouth, and pain from the procedure.  The patient gave oral consent to proceed.  The nasal cavities were decongested and anesthetised with a combination of oxymetazoline and 4% lidocaine solution.  The flexible scope was inserted into the right nasal cavity. NSD.   Endoscopy of the inferior and middle meatus was performed.  The edematous mucosa was as described above.  No polyp, mass, or lesion was appreciated.  Olfactory cleft was clear.  Nasopharynx was clear.  Turbinates were hypertrophied but without mass.  Incomplete response to decongestion.  The procedure was repeated on the contralateral side with septal spur.  The patient tolerated the procedure well.  Instructions were given to avoid eating or drinking for 2 hours.   Assessment 1.  Chronic rhinitis without evidence of acute sinusitis. No purulent drainage, polyps, or other suspicious mass or lesion is noted on today's nasal endoscopy. 2.   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   Plan  1.  The physical exam and nasal endoscopy findings are reviewed with the patient.  2. Treatment options include conservative management with daily steroid nasal spray versus septoplasty and bilateral inferior turbinate reduction. The risks, benefits, alternatives, and details of the procedure are reviewed with the patient. Questions are invited and answered. 3.  The patient is interested in proceeding with the procedure.  We will schedule the procedure in accordance with the family schedule.

## 2018-08-19 ENCOUNTER — Encounter (HOSPITAL_BASED_OUTPATIENT_CLINIC_OR_DEPARTMENT_OTHER): Payer: Self-pay | Admitting: Otolaryngology

## 2018-08-19 ENCOUNTER — Ambulatory Visit (INDEPENDENT_AMBULATORY_CARE_PROVIDER_SITE_OTHER): Payer: BC Managed Care – PPO | Admitting: Otolaryngology

## 2018-12-27 DIAGNOSIS — E785 Hyperlipidemia, unspecified: Secondary | ICD-10-CM | POA: Diagnosis not present

## 2018-12-27 DIAGNOSIS — Z23 Encounter for immunization: Secondary | ICD-10-CM | POA: Diagnosis not present

## 2018-12-27 DIAGNOSIS — Z125 Encounter for screening for malignant neoplasm of prostate: Secondary | ICD-10-CM | POA: Diagnosis not present

## 2018-12-27 DIAGNOSIS — Z0000 Encounter for general adult medical examination without abnormal findings: Secondary | ICD-10-CM | POA: Diagnosis not present

## 2019-01-17 ENCOUNTER — Other Ambulatory Visit: Payer: Self-pay | Admitting: Gastroenterology

## 2019-01-17 DIAGNOSIS — Z1211 Encounter for screening for malignant neoplasm of colon: Secondary | ICD-10-CM | POA: Diagnosis not present

## 2019-01-17 DIAGNOSIS — K760 Fatty (change of) liver, not elsewhere classified: Secondary | ICD-10-CM | POA: Diagnosis not present

## 2019-01-31 ENCOUNTER — Ambulatory Visit
Admission: RE | Admit: 2019-01-31 | Discharge: 2019-01-31 | Disposition: A | Discharge status: Home / Self Care | Payer: BC Managed Care – PPO | Source: Ambulatory Visit | Attending: Gastroenterology | Admitting: Gastroenterology

## 2019-01-31 DIAGNOSIS — K760 Fatty (change of) liver, not elsewhere classified: Secondary | ICD-10-CM

## 2019-03-05 DIAGNOSIS — Z1159 Encounter for screening for other viral diseases: Secondary | ICD-10-CM | POA: Diagnosis not present

## 2019-03-31 DIAGNOSIS — Z8616 Personal history of COVID-19: Secondary | ICD-10-CM | POA: Diagnosis not present

## 2019-04-16 DIAGNOSIS — Z1159 Encounter for screening for other viral diseases: Secondary | ICD-10-CM | POA: Diagnosis not present

## 2019-04-21 DIAGNOSIS — Z1211 Encounter for screening for malignant neoplasm of colon: Secondary | ICD-10-CM | POA: Diagnosis not present

## 2019-04-21 DIAGNOSIS — D120 Benign neoplasm of cecum: Secondary | ICD-10-CM | POA: Diagnosis not present

## 2019-04-21 DIAGNOSIS — K635 Polyp of colon: Secondary | ICD-10-CM | POA: Diagnosis not present

## 2019-07-01 DIAGNOSIS — G4733 Obstructive sleep apnea (adult) (pediatric): Secondary | ICD-10-CM | POA: Diagnosis not present

## 2019-07-01 DIAGNOSIS — J310 Chronic rhinitis: Secondary | ICD-10-CM | POA: Diagnosis not present

## 2019-12-09 DIAGNOSIS — E785 Hyperlipidemia, unspecified: Secondary | ICD-10-CM | POA: Diagnosis not present

## 2019-12-09 DIAGNOSIS — Z0000 Encounter for general adult medical examination without abnormal findings: Secondary | ICD-10-CM | POA: Diagnosis not present

## 2019-12-09 DIAGNOSIS — Z23 Encounter for immunization: Secondary | ICD-10-CM | POA: Diagnosis not present

## 2019-12-09 DIAGNOSIS — Z125 Encounter for screening for malignant neoplasm of prostate: Secondary | ICD-10-CM | POA: Diagnosis not present

## 2019-12-18 DIAGNOSIS — D1801 Hemangioma of skin and subcutaneous tissue: Secondary | ICD-10-CM | POA: Diagnosis not present

## 2019-12-18 DIAGNOSIS — D485 Neoplasm of uncertain behavior of skin: Secondary | ICD-10-CM | POA: Diagnosis not present

## 2019-12-18 DIAGNOSIS — L821 Other seborrheic keratosis: Secondary | ICD-10-CM | POA: Diagnosis not present

## 2020-02-08 IMAGING — US US ABDOMEN LIMITED
1 series · 14 of 25 positions shown · non-contrast
Comparison: 08/10/2011

CLINICAL DATA: Nonalcoholic fatty liver disease.

EXAM:
ULTRASOUND ABDOMEN LIMITED RIGHT UPPER QUADRANT

[Series 1: us abdomen limited · 0.23mm/px · 14 of 28 slices shown]
[im 1/28]
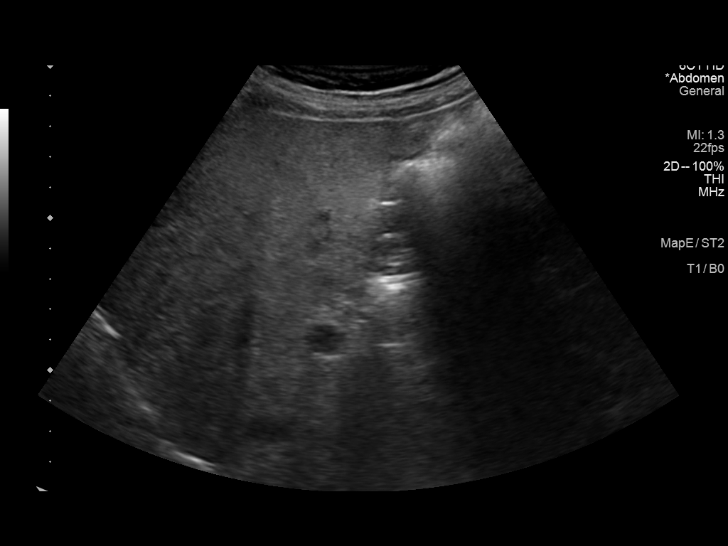
[im 3/28]
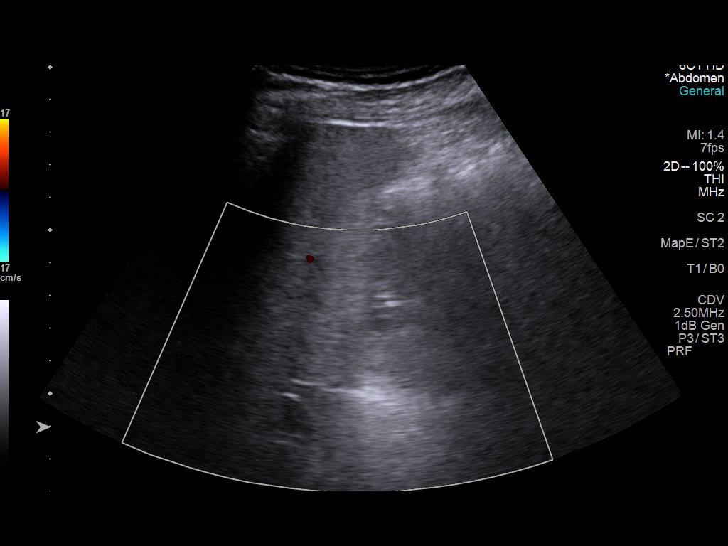
[im 5/28]
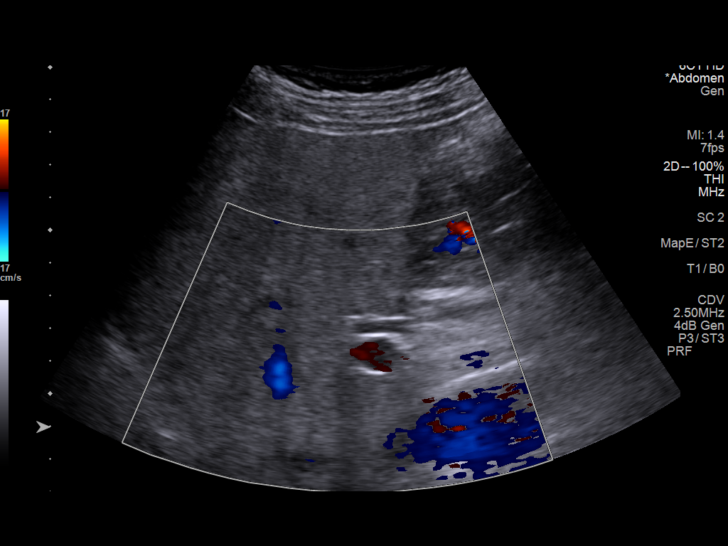
[im 7/28]
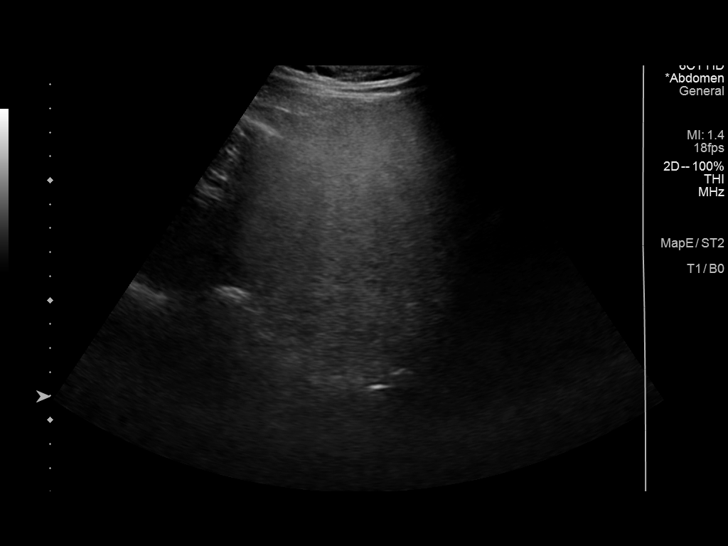
[im 10/28]
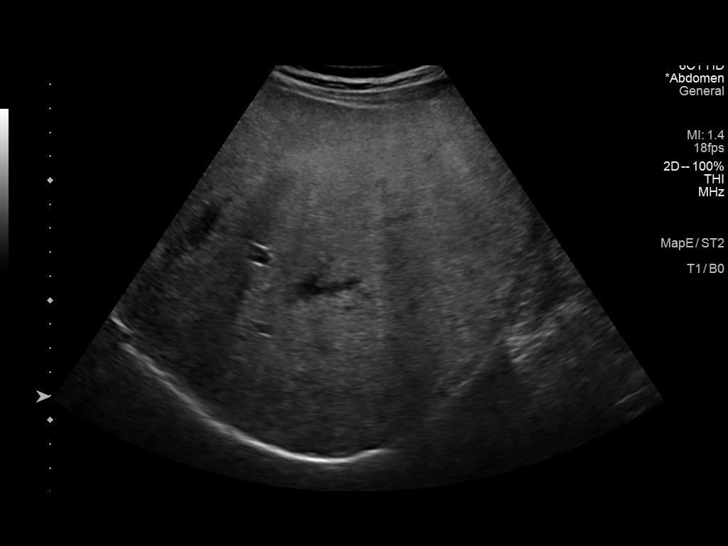
[im 11/28]
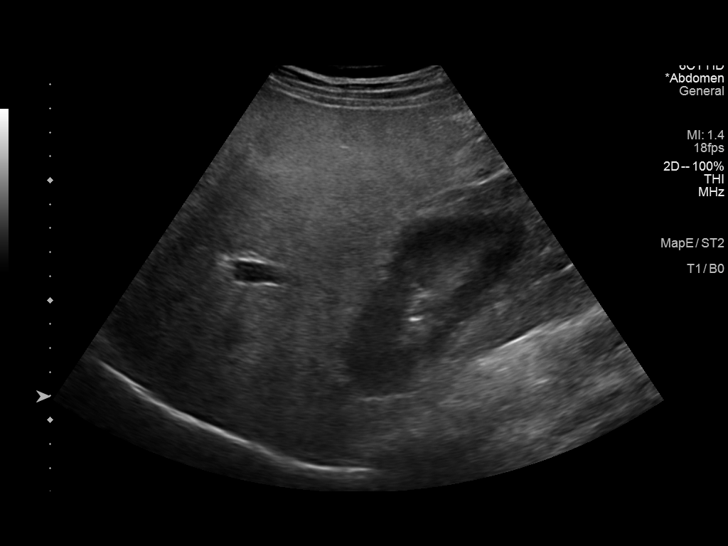
[im 13/28]
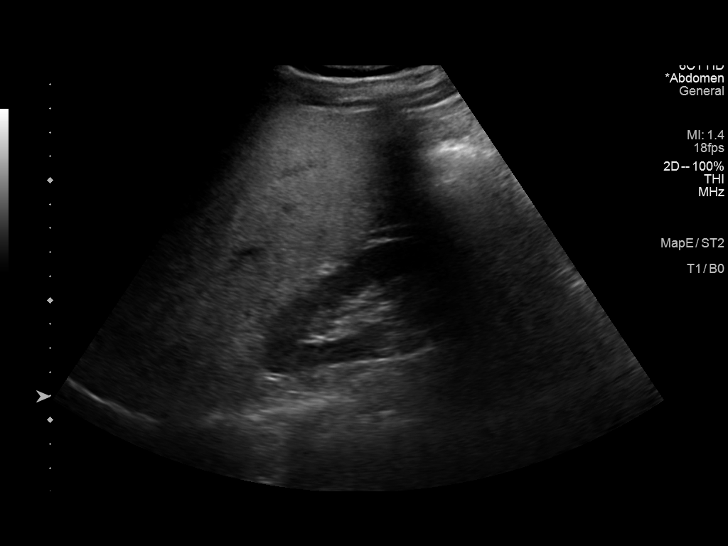
[im 15/28]
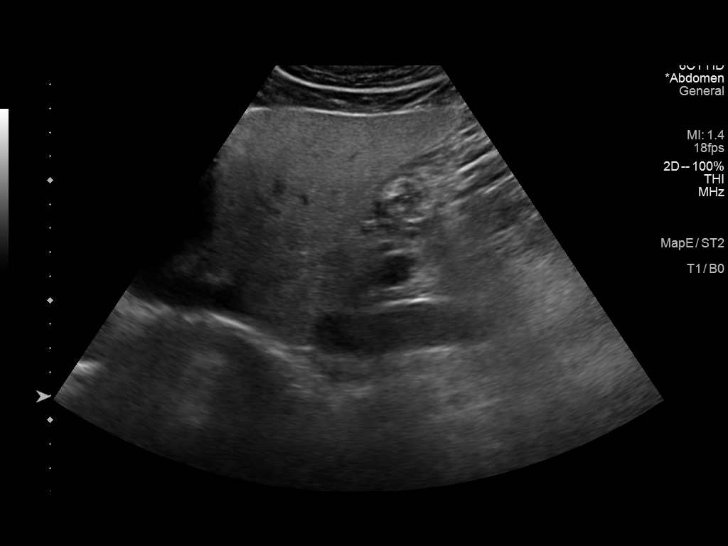
[im 17/28]
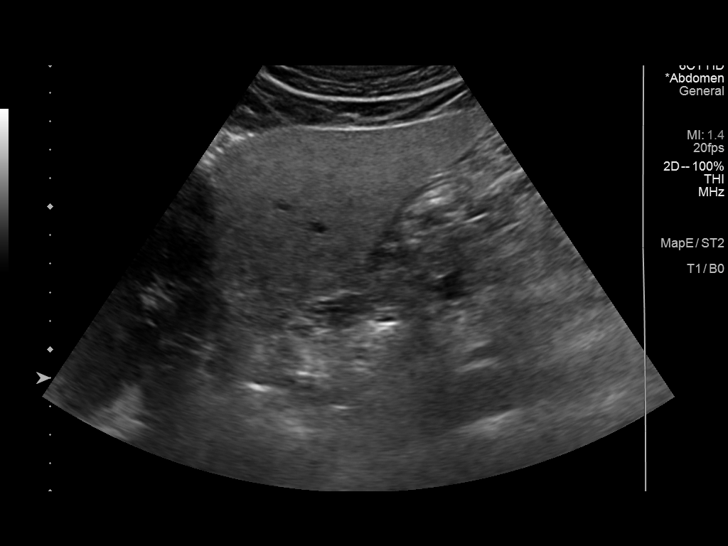
[im 19/28]
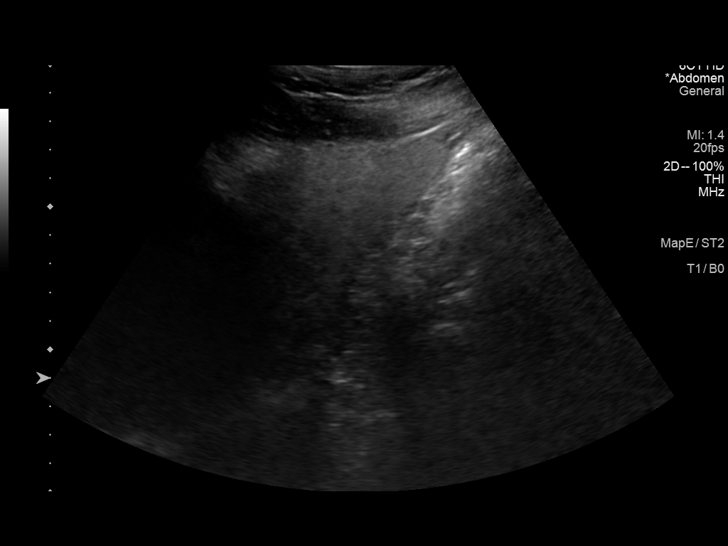
[im 21/28]
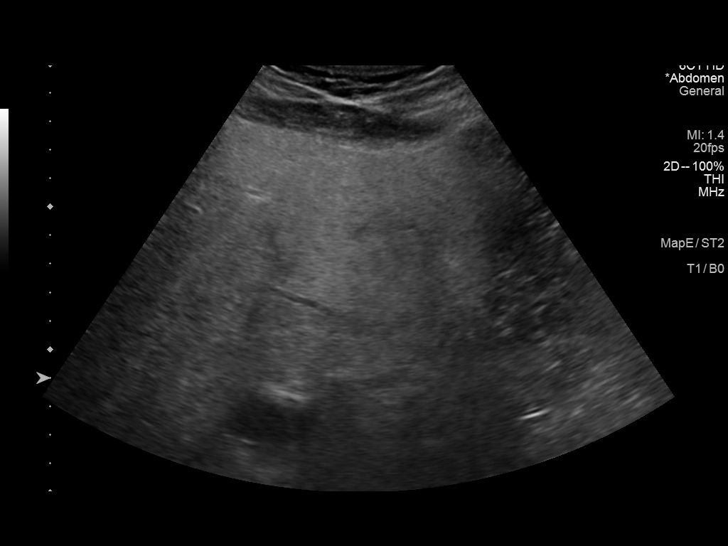
[im 23/28]
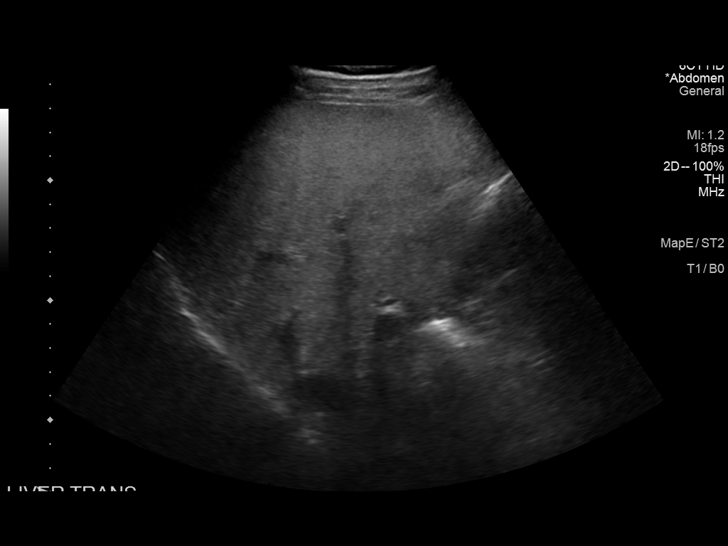
[im 25/28]
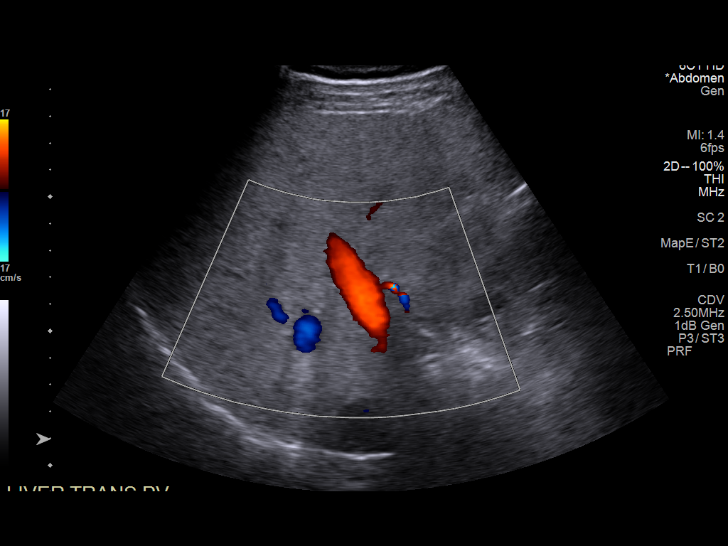
[im 28/28]
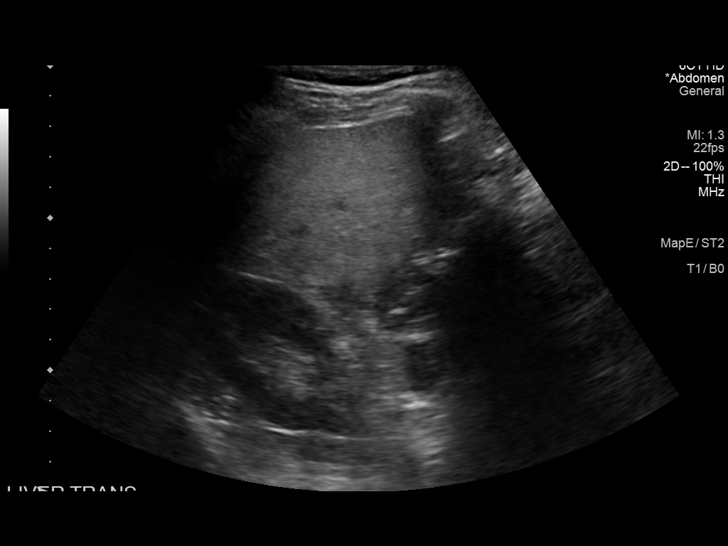

[14 of 25 positions shown; findings below may reference images not displayed]

FINDINGS: Gallbladder:

Surgically absent.

Common bile duct:

Diameter: 4 mm, within normal limits.

Liver:

Diffusely increased echogenicity of the hepatic parenchyma,
consistent with hepatic steatosis. This shows no significant change
in appearance compared to prior ultrasound. No hepatic mass
identified. Portal vein is patent on color Doppler imaging with
normal direction of blood flow towards the liver.

Other: None.
IMPRESSION: Prior cholecystectomy.  No evidence of biliary ductal dilatation.

Diffuse hepatic steatosis, similar in appearance to previous study

## 2020-10-14 DIAGNOSIS — Z20822 Contact with and (suspected) exposure to covid-19: Secondary | ICD-10-CM | POA: Diagnosis not present

## 2021-01-14 DIAGNOSIS — Z131 Encounter for screening for diabetes mellitus: Secondary | ICD-10-CM | POA: Diagnosis not present

## 2021-01-14 DIAGNOSIS — Z0000 Encounter for general adult medical examination without abnormal findings: Secondary | ICD-10-CM | POA: Diagnosis not present

## 2021-01-14 DIAGNOSIS — Z23 Encounter for immunization: Secondary | ICD-10-CM | POA: Diagnosis not present

## 2021-01-14 DIAGNOSIS — Z13220 Encounter for screening for lipoid disorders: Secondary | ICD-10-CM | POA: Diagnosis not present

## 2021-01-14 DIAGNOSIS — Z125 Encounter for screening for malignant neoplasm of prostate: Secondary | ICD-10-CM | POA: Diagnosis not present

## 2021-04-12 DIAGNOSIS — Z79899 Other long term (current) drug therapy: Secondary | ICD-10-CM | POA: Diagnosis not present

## 2021-04-12 DIAGNOSIS — W450XXA Nail entering through skin, initial encounter: Secondary | ICD-10-CM | POA: Diagnosis not present

## 2021-04-12 DIAGNOSIS — M79672 Pain in left foot: Secondary | ICD-10-CM | POA: Diagnosis not present

## 2021-04-12 DIAGNOSIS — R031 Nonspecific low blood-pressure reading: Secondary | ICD-10-CM | POA: Diagnosis not present

## 2021-04-12 DIAGNOSIS — Z23 Encounter for immunization: Secondary | ICD-10-CM | POA: Diagnosis not present

## 2021-04-12 DIAGNOSIS — S91332A Puncture wound without foreign body, left foot, initial encounter: Secondary | ICD-10-CM | POA: Diagnosis not present

## 2021-04-12 DIAGNOSIS — S99922A Unspecified injury of left foot, initial encounter: Secondary | ICD-10-CM | POA: Diagnosis not present

## 2021-04-13 DIAGNOSIS — S99922A Unspecified injury of left foot, initial encounter: Secondary | ICD-10-CM | POA: Diagnosis not present

## 2021-12-02 DIAGNOSIS — Z0000 Encounter for general adult medical examination without abnormal findings: Secondary | ICD-10-CM | POA: Diagnosis not present

## 2021-12-02 DIAGNOSIS — Z23 Encounter for immunization: Secondary | ICD-10-CM | POA: Diagnosis not present

## 2021-12-08 DIAGNOSIS — Z131 Encounter for screening for diabetes mellitus: Secondary | ICD-10-CM | POA: Diagnosis not present

## 2021-12-08 DIAGNOSIS — Z125 Encounter for screening for malignant neoplasm of prostate: Secondary | ICD-10-CM | POA: Diagnosis not present

## 2021-12-08 DIAGNOSIS — Z13220 Encounter for screening for lipoid disorders: Secondary | ICD-10-CM | POA: Diagnosis not present

## 2021-12-09 DIAGNOSIS — R7301 Impaired fasting glucose: Secondary | ICD-10-CM | POA: Diagnosis not present

## 2021-12-14 DIAGNOSIS — H903 Sensorineural hearing loss, bilateral: Secondary | ICD-10-CM | POA: Diagnosis not present

## 2022-09-26 DIAGNOSIS — K760 Fatty (change of) liver, not elsewhere classified: Secondary | ICD-10-CM | POA: Diagnosis not present

## 2022-09-26 DIAGNOSIS — K649 Unspecified hemorrhoids: Secondary | ICD-10-CM | POA: Diagnosis not present

## 2022-09-26 DIAGNOSIS — Z23 Encounter for immunization: Secondary | ICD-10-CM | POA: Diagnosis not present

## 2022-09-26 DIAGNOSIS — R7303 Prediabetes: Secondary | ICD-10-CM | POA: Diagnosis not present

## 2022-09-26 DIAGNOSIS — Z0000 Encounter for general adult medical examination without abnormal findings: Secondary | ICD-10-CM | POA: Diagnosis not present

## 2022-09-26 DIAGNOSIS — Z125 Encounter for screening for malignant neoplasm of prostate: Secondary | ICD-10-CM | POA: Diagnosis not present

## 2022-09-26 DIAGNOSIS — E785 Hyperlipidemia, unspecified: Secondary | ICD-10-CM | POA: Diagnosis not present

## 2022-12-22 DIAGNOSIS — L03011 Cellulitis of right finger: Secondary | ICD-10-CM | POA: Diagnosis not present

## 2023-03-29 DIAGNOSIS — R7303 Prediabetes: Secondary | ICD-10-CM | POA: Diagnosis not present

## 2023-03-29 DIAGNOSIS — Z23 Encounter for immunization: Secondary | ICD-10-CM | POA: Diagnosis not present

## 2023-04-22 DIAGNOSIS — R197 Diarrhea, unspecified: Secondary | ICD-10-CM | POA: Diagnosis not present

## 2023-10-10 DIAGNOSIS — L729 Follicular cyst of the skin and subcutaneous tissue, unspecified: Secondary | ICD-10-CM | POA: Diagnosis not present

## 2023-10-10 DIAGNOSIS — Z0000 Encounter for general adult medical examination without abnormal findings: Secondary | ICD-10-CM | POA: Diagnosis not present

## 2023-10-10 DIAGNOSIS — R7303 Prediabetes: Secondary | ICD-10-CM | POA: Diagnosis not present

## 2023-10-10 DIAGNOSIS — E785 Hyperlipidemia, unspecified: Secondary | ICD-10-CM | POA: Diagnosis not present

## 2023-10-10 DIAGNOSIS — K760 Fatty (change of) liver, not elsewhere classified: Secondary | ICD-10-CM | POA: Diagnosis not present

## 2023-10-10 DIAGNOSIS — Z125 Encounter for screening for malignant neoplasm of prostate: Secondary | ICD-10-CM | POA: Diagnosis not present

## 2023-12-14 ENCOUNTER — Ambulatory Visit (INDEPENDENT_AMBULATORY_CARE_PROVIDER_SITE_OTHER): Admitting: Podiatry

## 2023-12-14 VITALS — Ht 67.0 in | Wt 162.0 lb

## 2023-12-14 DIAGNOSIS — B351 Tinea unguium: Secondary | ICD-10-CM | POA: Diagnosis not present

## 2023-12-14 DIAGNOSIS — M6740 Ganglion, unspecified site: Secondary | ICD-10-CM

## 2023-12-14 DIAGNOSIS — L84 Corns and callosities: Secondary | ICD-10-CM

## 2023-12-14 NOTE — Patient Instructions (Signed)
 You can use UREA 40% CREAM on the calluses   --  Terbinafine Tablets What is this medication? TERBINAFINE (TER bin a feen) treats fungal infections of the nails. It belongs to a group of medications called antifungals. It will not treat infections caused by bacteria or viruses. This medicine may be used for other purposes; ask your health care provider or pharmacist if you have questions. COMMON BRAND NAME(S): Lamisil, Terbinex What should I tell my care team before I take this medication? They need to know if you have any of these conditions: Liver disease An unusual or allergic reaction to terbinafine, other medications, foods, dyes, or preservatives Pregnant or trying to get pregnant Breast-feeding How should I use this medication? Take this medication by mouth with water. Take it as directed on the prescription label at the same time every day. You can take it with or without food. If it upsets your stomach, take it with food. Keep taking it unless your care team tells you to stop. A special MedGuide will be given to you by the pharmacist with each prescription and refill. Be sure to read this information carefully each time. Talk to your care team about the use of this medication in children. Special care may be needed. Overdosage: If you think you have taken too much of this medicine contact a poison control center or emergency room at once. NOTE: This medicine is only for you. Do not share this medicine with others. What if I miss a dose? If you miss a dose, take it as soon as you can unless it is more than 4 hours late. If it is more than 4 hours late, skip the missed dose. Take the next dose at the normal time. What may interact with this medication? Do not take this medication with any of the following: Pimozide Thioridazine This medication may also interact with the following: Beta blockers Caffeine Certain medications for mental health  conditions Cimetidine Cyclosporine Medications for fungal infections, such as fluconazole or ketoconazole Medications for irregular heartbeat, such as amiodarone, flecainide, propafenone Rifampin Warfarin This list may not describe all possible interactions. Give your health care provider a list of all the medicines, herbs, non-prescription drugs, or dietary supplements you use. Also tell them if you smoke, drink alcohol, or use illegal drugs. Some items may interact with your medicine. What should I watch for while using this medication? Visit your care team for regular checks on your progress. Tell your care team if your symptoms do not start to get better or if they get worse. This medication may cause serious skin reactions. They can happen weeks to months after starting the medication. Contact your care team right away if you notice fevers or flu-like symptoms with a rash. The rash may be red or purple and then turn into blisters or peeling of the skin. You may also notice a red rash with swelling of the face, lips, or lymph nodes in your neck or under your arms. This medication can make you more sensitive to the sun. Keep out of the sun. If you cannot avoid being in the sun, wear protective clothing and sunscreen. Do not use sun lamps, tanning beds, or tanning booths. What side effects may I notice from receiving this medication? Side effects that you should report to your care team as soon as possible: Allergic reactions--skin rash, itching, hives, swelling of the face, lips, tongue, or throat Change in sense of smell Change in taste Infection--fever, chills, cough, or sore  throat Liver injury--right upper belly pain, loss of appetite, nausea, light-colored stool, dark yellow or brown urine, yellowing skin or eyes, unusual weakness or fatigue Low red blood cell level--unusual weakness or fatigue, dizziness, headache, trouble breathing Lupus-like syndrome--joint pain, swelling, or  stiffness, butterfly-shaped rash on the face, rashes that get worse in the sun, fever, unusual weakness or fatigue Rash, fever, and swollen lymph nodes Redness, blistering, peeling, or loosening of the skin, including inside the mouth Unusual bruising or bleeding Worsening mood, feelings of depression Side effects that usually do not require medical attention (report to your care team if they continue or are bothersome): Diarrhea Gas Headache Nausea Stomach pain Upset stomach This list may not describe all possible side effects. Call your doctor for medical advice about side effects. You may report side effects to FDA at 1-800-FDA-1088. Where should I keep my medication? Keep out of the reach of children and pets. Store between 20 and 25 degrees C (68 and 77 degrees F). Protect from light. Get rid of any unused medication after the expiration date. To get rid of medications that are no longer needed or have expired: Take the medication to a medication take-back program. Check with your pharmacy or law enforcement to find a location. If you cannot return the medication, check the label or package insert to see if the medication should be thrown out in the garbage or flushed down the toilet. If you are not sure, ask your care team. If it is safe to put it in the trash, take the medication out of the container. Mix the medication with cat litter, dirt, coffee grounds, or other unwanted substance. Seal the mixture in a bag or container. Put it in the trash. NOTE: This sheet is a summary. It may not cover all possible information. If you have questions about this medicine, talk to your doctor, pharmacist, or health care provider.  2024 Elsevier/Gold Standard (2022-09-01 00:00:00)

## 2023-12-14 NOTE — Progress Notes (Signed)
 Subjective:  Patient ID: Nathaniel Padilla, male    DOB: 03-25-1970,  MRN: [id number]  Chief Complaint  Patient presents with   Nail Problem    RM 12 Patient is here for dark areas on right hallux and both fifth toe nails, reoccuring blister on 2nd left toe and bilateral calluses.    Discussed the use of AI scribe software for clinical note transcription with the patient, who gave verbal consent to proceed.  History of Present Illness Nathaniel Padilla is a 53 year old male who presents with toenail fungus and a blister or cyst on the left second toe.  He experiences frequent trauma to his toes from playing soccer, leading to broken toenails. The left second toe has a blister-like formation present for less than a year, which bursts and heals repeatedly without returning to normal. The fluid is oily and thicker with a clear substance. He cleans it himself, but it recurs. He uses a topical treatment for toenail fungus and prefers oral medication if necessary.  He has calluses due to pressure from soccer and footwear, which he trims himself. He uses wide shoes to alleviate pressure but finds it challenging with soccer cleats. He reports reduced sensation in the affected area, especially in colder weather. No systemic symptoms such as fever or chills.      Objective:  There were no vitals filed for this visit.  Physical Exam General: AAO x3, NAD  Dermatological: Hyperkeratotic lesions medial hallux, first MPJ right side worse than left without any underlying ulceration, drainage or signs of infection.  Hallux toenails is well as the fifth digit nails are mildly hypertrophic, dystrophic with yellow, brown discoloration.  No hyperpigmentation.  No edema no erythema.  No open lesions.    Vascular: Dorsalis Pedis artery and Posterior Tibial artery pedal pulses are 2/4 bilateral with immedate capillary fill time.  There is no pain with calf compression, swelling, warmth, erythema.   Neruologic:  Grossly intact via light touch bilateral.   Musculoskeletal: On the left dorsal second toe on the DIPJ is a small scab.  This is on the area we had the cyst out previously.  Clinically send sounds like a mucoid cyst.  There is no signs of infection.  Gait: Unassisted, Nonantalgic.     No images are attached to the encounter.    Results    Assessment:   1. Onychomycosis   2. Callus   3. Mucoid cyst of joint      Plan:  Patient was evaluated and treated and all questions answered.  Assessment and Plan Assessment & Plan Onychomycosis Suspected toenail fungus. - Culture toenail to confirm fungal infection before starting oral antifungal treatment. - Prescribe oral terbinafine pending culture results. - Monitor liver function tests before starting terbinafine and halfway through treatment. - Continue current topical treatment until culture results are available.  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 - Provide cushioning for the left second toe to alleviate pressure during activities. - Consider steroid injection if the cyst recurs and becomes bothersome. - Discuss surgical excision if conservative measures fail and the cyst remains problematic.  Calluses of feet Calluses due to pressure from activity and footwear. - Trim calluses during the visit as a courtesy without any complications or bleeding. - Recommend urea cream for regular use to manage calluses. - Advise on proper footwear to reduce pressure and prevent callus formation.  Traumatic injury to toenails (recurrent) Recurrent  toenail trauma likely due to soccer activities. - Provide cushioning for toes to reduce trauma during soccer. - Advise on protective footwear to minimize risk of toenail injuries.    Return for nail culture results .   Donnice JONELLE Fees DPM

## 2023-12-20 ENCOUNTER — Other Ambulatory Visit: Payer: Self-pay | Admitting: Podiatry

## 2023-12-26 ENCOUNTER — Ambulatory Visit: Payer: Self-pay | Admitting: Podiatry

## 2023-12-27 ENCOUNTER — Other Ambulatory Visit: Payer: Self-pay | Admitting: Podiatry

## 2023-12-27 MED ORDER — EFINACONAZOLE 10 % EX SOLN: 1.0000 [drp] | Freq: Every day | CUTANEOUS | 11 refills | Status: AC

## 2024-01-04 ENCOUNTER — Other Ambulatory Visit: Payer: Self-pay | Admitting: Podiatry

## 2024-01-04 MED ORDER — CICLOPIROX 8 % EX SOLN: Freq: Every day | CUTANEOUS | 2 refills | Status: AC

## 2024-01-04 NOTE — Progress Notes (Signed)
 Sent ciclopirox as a substitute for [employer]

## 2024-02-19 ENCOUNTER — Ambulatory Visit (INDEPENDENT_AMBULATORY_CARE_PROVIDER_SITE_OTHER): Admitting: Physician Assistant

## 2024-02-19 ENCOUNTER — Encounter: Payer: Self-pay | Admitting: Physician Assistant

## 2024-02-19 VITALS — BP 121/82

## 2024-02-19 DIAGNOSIS — R220 Localized swelling, mass and lump, head: Secondary | ICD-10-CM | POA: Diagnosis not present

## 2024-02-19 DIAGNOSIS — D489 Neoplasm of uncertain behavior, unspecified: Secondary | ICD-10-CM

## 2024-02-19 DIAGNOSIS — M67472 Ganglion, left ankle and foot: Secondary | ICD-10-CM | POA: Diagnosis not present

## 2024-02-19 NOTE — Patient Instructions (Signed)

## 2024-02-19 NOTE — Progress Notes (Signed)
" ° °  New Patient Visit   Subjective  Nathaniel Padilla is a 53 y.o. male NEW PATIENT who presents for the following: He has a bump of his forehead that has been there for a couple of years. His wife says it is growing. He also has a bump on his left 2nd toe that is growing. He plays soccer. It pops and it drains a thick clear oily liquid then fills back up.    The following portions of the chart were reviewed this encounter and updated as appropriate: medications, allergies, medical history  Review of Systems:  No other skin or systemic complaints except as noted in HPI or Assessment and Plan.  Objective  Well appearing patient in no apparent distress; mood and affect are within normal limits.   A focused examination was performed of the following areas: Face, left foot   Relevant exam findings are noted in the Assessment and Plan.     Assessment & Plan   CYST VS LIPOMA Exam: 1.0 cm soft SQ nodule of left forehead  Treatment Plan: Advised patient it is benign appearing.Discussed treatment options. We will refer him to Dr Montorfano at [facility].   DIGITAL MUCOUS CYST Exam: Solitary, smooth skin colored to translucent papule of left 2nd toe  A digital mucous cyst also known as a myxoid cyst or pseudocyst is a ganglion cyst arising from the distal interphalangeal (DIP) joint of the finger or thumb (or, less commonly, toe). The cysts are believed to form from degeneration of connective tissue and are associated with osteoarthritic joints or injury. Although the exact etiology is unknown, it is likely that a small tear forms in a joint capsule or tendon sheath, allowing extravasation of synovial fluid into the adjacent tissue. When the fluid reacts with local tissue, it becomes more gelatinous and a cyst wall forms. With any treatment, there is a high rate of recurrence.   Treatment options include: - Puncture / Incision & Drainage (I&D) - Intralesional steroid  injection - Intralesional Sclerosant injection (Asclera/ Polidocanol) - Intralesional steroid + sclerosant - Corticosteroid tape - Cryosurgery - Laser (CO2) - Infrared photocoagulation - Excision / Surgery  Treatment Plan: Advised patient it is benign appearing. Discussed treatment options. We will refer him to [facility] for evaluation.  NEOPLASM OF UNCERTAIN BEHAVIOR   This Visit - Ambulatory referral to Plastic Surgery DIGITAL MUCOUS CYST OF TOE OF LEFT FOOT   This Visit - AMB referral to orthopedics  Return if symptoms worsen or fail to improve.  I, Roseline Hutchinson, CMA, am acting as scribe for Hallee Mckenny K, PA-C .   Documentation: I have reviewed the above documentation for accuracy and completeness, and I agree with the above.  Kedarius Aloisi K, PA-C    "

## 2024-03-19 ENCOUNTER — Ambulatory Visit: Admitting: Plastic Surgery

## 2024-03-19 DIAGNOSIS — R220 Localized swelling, mass and lump, head: Secondary | ICD-10-CM

## 2024-03-19 DIAGNOSIS — D489 Neoplasm of uncertain behavior, unspecified: Secondary | ICD-10-CM

## 2024-03-19 NOTE — Progress Notes (Signed)
 Nathaniel Padilla does   Referring Provider Orman Erminio POUR, PA-C [address] [place],  [place] [address]   CC:  Chief Complaint  Patient presents with   Consult      Nathaniel Padilla is an 54 y.o. male.  HPI: Nathaniel Padilla is a 54 year old male who presents today for evaluation of a mass on the left forehead.  This has been there for approximately 2 years but he believes it is doubled in size and would like to have it removed.  There is been no drainage from the mass but there is pain when the mass is palpated.  He does not remember any trauma to the area but the patient does play soccer.  Allergies[1]  Outpatient Encounter Medications as of 03/19/2024  Medication Sig   ciclopirox  (PENLAC ) 8 % solution Apply topically at bedtime. Apply over nail and surrounding skin. Apply daily over previous coat. After seven (7) days, may remove with alcohol and continue cycle.   Efinaconazole  10 % SOLN Apply 1 drop topically daily.   loratadine (CLARITIN) 10 MG tablet Take 10 mg by mouth daily.   No facility-administered encounter medications on file as of 03/19/2024.     Past Medical History:  Diagnosis Date   GERD (gastroesophageal reflux disease)     Past Surgical History:  Procedure Laterality Date   APPENDECTOMY  1988   CHOLECYSTECTOMY     NASAL SEPTOPLASTY W/ TURBINOPLASTY Bilateral 08/16/2018   Procedure: NASAL SEPTOPLASTY WITH  BILATERAL TURBINATE REDUCTION;  Surgeon: Karis Clunes, MD;  Location: [facility];  Service: ENT;  Laterality: Bilateral;    No family history on file.  Social History   Social History Narrative   Not on file     Review of Systems General: Denies fevers, chills, weight loss CV: Denies chest pain, shortness of breath, palpitations Skin: Small mass on the forehead to the left of midline that is tender when palpated  Physical Exam    02/19/2024    3:20 PM 12/14/2023   11:57 AM 08/16/2018   11:11 AM  Vitals with BMI  Height  5' 7    Weight  162 lbs   BMI  25.37   Systolic 121  151  Diastolic 82  88  Pulse   91    General:  No acute distress,  Alert and oriented, Non-Toxic, Normal speech and affect Skin: Patient has a small mass approximately 1 cm in length on the forehead to the left of midline.  Is difficult to tell if this mass is bone or if it is simply underneath the frontalis muscle.   Assessment/Plan Mass, forehead: Patient has a mass which has been present for approximately 2 years.  He would like to have it removed.  I showed him the location of the scar.  He understands that I will close the incision with permanent sutures that we will need to be removed 5 to 7 days after surgery.  I cannot guarantee what this mass is and offered to take him to the operating room to have it removed in case this is a bony mass.  The patient would prefer to have it done in the office so he will need a CT scan prior to the procedure so I know what it most likely is.  He is interested in getting pricing on CT scan and office procedure versus doing the excision in the operating room.  I gave him a list of the terms to discuss with his  insurance company and we will try to find this information out for him as well.  Once he has made a decision as to how he would like to proceed he will let me know and I will schedule the CT scan and office procedure or CT scan and appropriate procedure afterwards versus going straight to the OR for removal. All questions were answered to satisfaction.  Photographs were obtained today with his consent.  Nathaniel Padilla 03/19/2024, 4:54 PM         [1] Not on File
